# Patient Record
Sex: Male | Born: 1980 | Race: White | Hispanic: No | Marital: Married | Smoking: Never smoker
Health system: Southern US, Community
[De-identification: ages and names within clinical notes are randomized; demographics above are authoritative.]

## PROBLEM LIST (undated history)

## (undated) HISTORY — PX: FRACTURE SURGERY: SHX138

---

## 2004-01-10 ENCOUNTER — Observation Stay: Payer: Self-pay | Admitting: Orthopaedic Surgery

## 2004-01-27 ENCOUNTER — Ambulatory Visit: Payer: Self-pay | Admitting: Orthopaedic Surgery

## 2004-04-21 ENCOUNTER — Ambulatory Visit: Payer: Self-pay | Admitting: Orthopaedic Surgery

## 2013-02-15 ENCOUNTER — Ambulatory Visit (INDEPENDENT_AMBULATORY_CARE_PROVIDER_SITE_OTHER): Payer: BC Managed Care – PPO | Admitting: Physician Assistant

## 2013-02-15 ENCOUNTER — Telehealth: Payer: Self-pay

## 2013-02-15 VITALS — BP 116/76 | HR 64 | Temp 98.4°F | Resp 16 | Ht 73.5 in | Wt 241.6 lb

## 2013-02-15 DIAGNOSIS — Z23 Encounter for immunization: Secondary | ICD-10-CM

## 2013-02-15 DIAGNOSIS — S91309A Unspecified open wound, unspecified foot, initial encounter: Secondary | ICD-10-CM

## 2013-02-15 DIAGNOSIS — S91301A Unspecified open wound, right foot, initial encounter: Secondary | ICD-10-CM

## 2013-02-15 NOTE — Progress Notes (Signed)
  Subjective:    Patient ID: Ryan Montes, male    DOB: 23-May-1980, 32 y.o.   MRN: [id number]  HPI 32 year old male presents for Tdap vaccination.  Yesterday was walking outside in his socks and stepped on a nail. States the nail was on its side so just the end of went into his foot.  The wound did bleed a bit and is now slightly bruised. No erythema, warmth, drainage, or induration. Last tetanus about 7 years ago.   Patient is otherwise doing well with no other concerns today.     Review of Systems  Constitutional: Negative for fever and chills.  Respiratory: Negative for cough.   Neurological: Negative for headaches.       Objective:   Physical Exam  Constitutional: He is oriented to person, place, and time. He appears well-developed and well-nourished.  HENT:  Head: Normocephalic and atraumatic.  Right Ear: External ear normal.  Left Ear: External ear normal.  Eyes: Conjunctivae are normal.  Neck: Normal range of motion.  Cardiovascular: Normal rate.   Pulmonary/Chest: Effort normal.  Neurological: He is alert and oriented to person, place, and time.  Skin:  Plantar surface of right heel has small superficial puncture wound with slight bruising. No erythema, warmth, or drainage noted  Psychiatric: He has a normal mood and affect. His behavior is normal. Judgment and thought content normal.          Assessment & Plan:  Wound, open, foot, right, initial encounter  Need for Tdap vaccination - Plan: Tdap vaccine greater than or equal to 7yo IM  Tdap updated Wound care provided. Return if area becomes erythematous, warm, or more painful

## 2013-02-15 NOTE — Patient Instructions (Signed)

## 2015-04-02 ENCOUNTER — Ambulatory Visit (INDEPENDENT_AMBULATORY_CARE_PROVIDER_SITE_OTHER): Payer: Managed Care, Other (non HMO) | Admitting: Emergency Medicine

## 2015-04-02 VITALS — BP 104/64 | HR 73 | Temp 98.6°F | Resp 16 | Ht 75.0 in | Wt 239.0 lb

## 2015-04-02 DIAGNOSIS — J0140 Acute pansinusitis, unspecified: Secondary | ICD-10-CM | POA: Diagnosis not present

## 2015-04-02 DIAGNOSIS — Z23 Encounter for immunization: Secondary | ICD-10-CM

## 2015-04-02 DIAGNOSIS — R509 Fever, unspecified: Secondary | ICD-10-CM

## 2015-04-02 MED ORDER — DOXYCYCLINE HYCLATE 100 MG PO CAPS: 100.0000 mg | ORAL_CAPSULE | Freq: Two times a day (BID) | ORAL | Status: DC

## 2015-04-02 MED ORDER — PSEUDOEPHEDRINE-GUAIFENESIN ER 60-600 MG PO TB12: 1.0000 | ORAL_TABLET | Freq: Two times a day (BID) | ORAL | Status: AC

## 2015-04-02 NOTE — Patient Instructions (Signed)

## 2015-04-02 NOTE — Progress Notes (Signed)
Subjective:  Patient ID: Ryan Montes, male    DOB: 09/28/1980  Age: 35 y.o. MRN: [id number]  CC: Generalized Body Aches; Chills; Fever; Sore Throat; and Flu Vaccine   HPI Ryan Montes presents  patient comes with fever and nasal congestion drainage. He has postnasal drainage. It's purulent character. He has a fever of 102. He has no cough wheezing or shortness of breath. Sore throat. Is no wheezing or shortness of breath. He has no nausea vomiting or diarrhea. No stool change or rash.  History Ryan Montes has no past medical history on file.   He has past surgical history that includes Fracture surgery.   His  family history includes Cancer in his mother; Diabetes in his maternal grandfather.  He   reports that he has never smoked. He does not have any smokeless tobacco history on file. He reports that he drinks about 5.0 oz of alcohol per week. His drug history is not on file.  No outpatient prescriptions prior to visit.   No facility-administered medications prior to visit.    Social History   Social History  . Marital Status: Married    Spouse Name: N/A  . Number of Children: N/A  . Years of Education: N/A   Social History Main Topics  . Smoking status: Never Smoker   . Smokeless tobacco: None  . Alcohol Use: 5.0 oz/week    10 drink(s) per week  . Drug Use: None  . Sexual Activity: Not Asked   Other Topics Concern  . None   Social History Narrative     Review of Systems  Constitutional: Positive for fever, chills and fatigue. Negative for appetite change.  HENT: Positive for postnasal drip and sore throat. Negative for congestion, ear pain and sinus pressure.   Eyes: Negative for pain and redness.  Respiratory: Negative for cough, shortness of breath and wheezing.   Cardiovascular: Negative for leg swelling.  Gastrointestinal: Negative for nausea, vomiting, abdominal pain, diarrhea, constipation and blood in stool.  Endocrine: Negative for polyuria.    Genitourinary: Negative for dysuria, urgency, frequency and flank pain.  Musculoskeletal: Negative for gait problem.  Skin: Negative for rash.  Neurological: Negative for weakness and headaches.  Psychiatric/Behavioral: Negative for confusion and decreased concentration. The patient is not nervous/anxious.     Objective:  BP 104/64 mmHg  Pulse 73  Temp(Src) 98.6 F (37 C)  Resp 16  Ht 6\' 3"  (1.905 m)  Wt 239 lb (108.41 kg)  BMI 29.87 kg/m2  SpO2 98%  Physical Exam    Assessment & Plan:   Ryan Montes was seen today for generalized body aches, chills, fever, sore throat and flu vaccine.  Diagnoses and all orders for this visit:  Acute pansinusitis, recurrence not specified  Fever, unspecified fever cause  Needs flu shot -     Flu Vaccine QUAD 36+ mos IM  Other orders -     pseudoephedrine-guaifenesin (MUCINEX D) 60-600 MG 12 hr tablet; Take 1 tablet by mouth every 12 (twelve) hours. -     doxycycline (VIBRAMYCIN) 100 MG capsule; Take 1 capsule (100 mg total) by mouth 2 (two) times daily.   I am having Ryan Montes start on pseudoephedrine-guaifenesin and doxycycline.  Meds ordered this encounter  Medications  . pseudoephedrine-guaifenesin (MUCINEX D) 60-600 MG 12 hr tablet    Sig: Take 1 tablet by mouth every 12 (twelve) hours.    Dispense:  18 tablet    Refill:  0  . doxycycline (VIBRAMYCIN) 100 MG capsule  Sig: Take 1 capsule (100 mg total) by mouth 2 (two) times daily.    Dispense:  20 capsule    Refill:  0    Appropriate red flag conditions were discussed with the patient as well as actions that should be taken.  Patient expressed his understanding.  Follow-up: Return if symptoms worsen or fail to improve.  Carmelina Dane, MD

## 2015-04-03 ENCOUNTER — Ambulatory Visit (INDEPENDENT_AMBULATORY_CARE_PROVIDER_SITE_OTHER): Payer: Managed Care, Other (non HMO) | Admitting: Internal Medicine

## 2015-04-03 ENCOUNTER — Telehealth: Payer: Self-pay

## 2015-04-03 VITALS — BP 118/70 | HR 74 | Temp 98.1°F | Resp 18 | Ht 75.0 in | Wt 235.6 lb

## 2015-04-03 DIAGNOSIS — N509 Disorder of male genital organs, unspecified: Secondary | ICD-10-CM

## 2015-04-03 DIAGNOSIS — R102 Pelvic and perineal pain: Secondary | ICD-10-CM

## 2015-04-03 LAB — POCT URINALYSIS DIP (MANUAL ENTRY)
BILIRUBIN UA: NEGATIVE
Glucose, UA: NEGATIVE
Leukocytes, UA: NEGATIVE
Nitrite, UA: NEGATIVE
Protein Ur, POC: NEGATIVE
RBC UA: NEGATIVE
SPEC GRAV UA: 1.01
Urobilinogen, UA: 0.2
pH, UA: 6

## 2015-04-03 LAB — POC MICROSCOPIC URINALYSIS (UMFC)

## 2015-04-03 MED ORDER — MELOXICAM 15 MG PO TABS
15.0000 mg | ORAL_TABLET | Freq: Every day | ORAL | Status: DC
Start: 2015-04-03 — End: 2018-04-20

## 2015-04-03 NOTE — Telephone Encounter (Signed)
Pt called. Was seen yesterday for sinusitis and was given Doxy. He is now having testicle pain. Told them that it was highly unlikely that this was related and he should RTC to get that checked out. He agreed and will come in today

## 2015-04-03 NOTE — Progress Notes (Signed)
Subjective:  By signing my name below, I, Rawaa Al Rifaie, attest that this documentation has been prepared under the direction and in the presence of Ellamae Sia, MD.  Watt Climes Rifaie, Medical Scribe. 04/03/2015.  10:42 AM.   Patient ID: Ryan Montes, male    DOB: [birth year], 35 y.o.   MRN: [id number]  Chief Complaint  Patient presents with  . Generalized Body Aches    x2 days  . Testicular Pain    Started after taking antibiotics prescribed yesterday.     HPI HPI Comments: Ryan Montes is a 35 y.o. male who presents to [facility] complaining of groiin pain, gradual onset yesterday.  Pt was seen here at the office yesterday by Dr. Dareen Piano for acute pansinusitis. He was prescribed mucinex and doxycycline for the symptoms.  Pt reports that the symptoms were initially present prior to taking the antibiotics, however the area was only tender to the touch. He indicates that the symptoms then developed to being constant with the presence of sharp pain. He reports that pain to be present in his testicular area bilaterally, his upper groin area, and the area behind the penis. Pt also states that yesterday he was experiencing a cramping sensation to the arms and legs, but with no actual cramping, that prevented him from being able to sleep. He denies swelling or redness of the area, increased urination, dysuria, diarrhea, constipation, or difficulty to start the stream with urination.      There are no active problems to display for this patient.  History reviewed. No pertinent past medical history. Past Surgical History  Procedure Laterality Date  . Fracture surgery     Allergies  Allergen Reactions  . Augmentin [Amoxicillin-Pot Clavulanate] Hives  . Suprax [Cefixime] Hives   Prior to Admission medications   Medication Sig Start Date End Date Taking? Authorizing Provider  doxycycline (VIBRAMYCIN) 100 MG capsule Take 1 capsule (100 mg total) by mouth 2 (two)  times daily. 04/02/15  Yes Carmelina Dane, MD  pseudoephedrine-guaifenesin [facility] D) 60-600 MG 12 hr tablet Take 1 tablet by mouth every 12 (twelve) hours. 04/02/15 04/01/16 Yes Carmelina Dane, MD   Social History   Social History  . Marital Status: Married    Spouse Name: N/A  . Number of Children: N/A  . Years of Education: N/A   Occupational History  . Not on file.   Social History Main Topics  . Smoking status: Never Smoker   . Smokeless tobacco: Not on file  . Alcohol Use: 5.0 oz/week    10 drink(s) per week  . Drug Use: Not on file  . Sexual Activity: Not on file   Other Topics Concern  . Not on file   Social History Narrative     Review of Systems  Gastrointestinal: Negative for diarrhea and constipation.  Genitourinary: Positive for testicular pain. Negative for dysuria, frequency, penile swelling and difficulty urinating.  Musculoskeletal: Positive for myalgias.      Objective:   Physical Exam  Constitutional: He is oriented to person, place, and time. He appears well-developed and well-nourished. No distress.  HENT:  Head: Normocephalic and atraumatic.  Eyes: EOM are normal. Pupils are equal, round, and reactive to light.  Neck: Neck supple.  Cardiovascular: Normal rate.   Pulmonary/Chest: Effort normal.  Genitourinary:  No skin lesions, no obvious swelling. Tender in both inguinal canals without hernia.  Testicles and epididymes are non tender, but he is tender along the perineum without  swelling.   Neurological: He is alert and oriented to person, place, and time. No cranial nerve deficit.  Skin: Skin is warm and dry.  Psychiatric: He has a normal mood and affect. His behavior is normal.  Nursing note and vitals reviewed.   BP 118/70 mmHg  Pulse 74  Temp(Src) 98.1 F (36.7 C) (Oral)  Resp 18  Ht 6\' 3"  (1.905 m)  Wt 235 lb 9.6 oz (106.867 kg)  BMI 29.45 kg/m2  SpO2 97%     Assessment & Plan:  Pain in male perineum - Plan: POCT  Microscopic Urinalysis ([facility]), POCT urinalysis dipstick unclear etio--may be sports associated//follow Meds ordered this encounter  Medications  . meloxicam (MOBIC) 15 MG tablet    Sig: Take 1 tablet (15 mg total) by mouth daily.    Dispense:  10 tablet    Refill:  0      I have completed the patient encounter in its entirety as documented by the scribe, with editing by me where necessary. Cord Wilczynski P. Merla Richesoolittle, M.D.

## 2018-04-19 ENCOUNTER — Other Ambulatory Visit: Payer: Self-pay

## 2018-04-19 ENCOUNTER — Emergency Department (HOSPITAL_COMMUNITY): Payer: Managed Care, Other (non HMO) | Admitting: Certified Registered"

## 2018-04-19 ENCOUNTER — Encounter (HOSPITAL_COMMUNITY)
Admission: EM | Disposition: A | Discharge status: Home / Self Care | Payer: Self-pay | Source: Home / Self Care | Attending: Emergency Medicine

## 2018-04-19 ENCOUNTER — Encounter (HOSPITAL_COMMUNITY): Payer: Self-pay

## 2018-04-19 ENCOUNTER — Emergency Department (HOSPITAL_COMMUNITY): Payer: Managed Care, Other (non HMO)

## 2018-04-19 ENCOUNTER — Observation Stay (HOSPITAL_COMMUNITY)
Admission: EM | Admit: 2018-04-19 | Discharge: 2018-04-20 | Disposition: A | Discharge status: Home / Self Care | Payer: Managed Care, Other (non HMO) | Attending: Surgery | Admitting: Surgery

## 2018-04-19 DIAGNOSIS — K3580 Unspecified acute appendicitis: Principal | ICD-10-CM | POA: Diagnosis present

## 2018-04-19 DIAGNOSIS — Z791 Long term (current) use of non-steroidal anti-inflammatories (NSAID): Secondary | ICD-10-CM | POA: Insufficient documentation

## 2018-04-19 DIAGNOSIS — R109 Unspecified abdominal pain: Secondary | ICD-10-CM | POA: Diagnosis present

## 2018-04-19 HISTORY — PX: LAPAROSCOPIC APPENDECTOMY: SHX408

## 2018-04-19 LAB — COMPREHENSIVE METABOLIC PANEL
ALBUMIN: 4.5 g/dL (ref 3.5–5.0)
ALT: 33 U/L (ref 0–44)
AST: 29 U/L (ref 15–41)
Alkaline Phosphatase: 68 U/L (ref 38–126)
Anion gap: 10 (ref 5–15)
BUN: 13 mg/dL (ref 6–20)
CO2: 23 mmol/L (ref 22–32)
Calcium: 9.3 mg/dL (ref 8.9–10.3)
Chloride: 105 mmol/L (ref 98–111)
Creatinine, Ser: 0.86 mg/dL (ref 0.61–1.24)
GFR calc Af Amer: >60 mL/min (ref >60)
GFR calc non Af Amer: >60 mL/min (ref >60)
GLUCOSE: 98 mg/dL (ref 70–99)
Potassium: 3.7 mmol/L (ref 3.5–5.1)
SODIUM: 138 mmol/L (ref 135–145)
Total Bilirubin: 0.8 mg/dL (ref 0.3–1.2)
Total Protein: 8.1 g/dL (ref 6.5–8.1)

## 2018-04-19 LAB — LIPASE, BLOOD: Lipase: 32 U/L (ref 11–51)

## 2018-04-19 LAB — CBC WITH DIFFERENTIAL/PLATELET
Abs Immature Granulocytes: 0.02 10*3/uL (ref 0.00–0.07)
BASOS ABS: 0.1 10*3/uL (ref 0.0–0.1)
Basophils Relative: 1 %
Eosinophils Absolute: 0.1 10*3/uL (ref 0.0–0.5)
Eosinophils Relative: 1 %
HCT: 43.2 % (ref 39.0–52.0)
Hemoglobin: 13.8 g/dL (ref 13.0–17.0)
Immature Granulocytes: 0 %
Lymphocytes Relative: 22 %
Lymphs Abs: 2.5 10*3/uL (ref 0.7–4.0)
MCH: 29.6 pg (ref 26.0–34.0)
MCHC: 31.9 g/dL (ref 30.0–36.0)
MCV: 92.7 fL (ref 80.0–100.0)
Monocytes Absolute: 1.2 10*3/uL — ABNORMAL HIGH (ref 0.1–1.0)
Monocytes Relative: 11 %
NRBC: 0 % (ref 0.0–0.2)
Neutro Abs: 7.3 10*3/uL (ref 1.7–7.7)
Neutrophils Relative %: 65 %
Platelets: 219 10*3/uL (ref 150–400)
RBC: 4.66 MIL/uL (ref 4.22–5.81)
RDW: 13.4 % (ref 11.5–15.5)
WBC: 11.1 10*3/uL — ABNORMAL HIGH (ref 4.0–10.5)

## 2018-04-19 SURGERY — APPENDECTOMY, LAPAROSCOPIC
Anesthesia: General | Site: Abdomen

## 2018-04-19 MED ORDER — CIPROFLOXACIN IN D5W 400 MG/200ML IV SOLN
400.0000 mg | INTRAVENOUS | Status: AC
  Administered 2018-04-19: 400 mg via INTRAVENOUS

## 2018-04-19 MED ORDER — MIDAZOLAM HCL 2 MG/2ML IJ SOLN
INTRAMUSCULAR | Status: DC | PRN
  Administered 2018-04-19: 2 mg via INTRAVENOUS

## 2018-04-19 MED ORDER — SUGAMMADEX SODIUM 200 MG/2ML IV SOLN
INTRAVENOUS | Status: DC | PRN
  Administered 2018-04-19: 230 mg via INTRAVENOUS

## 2018-04-19 MED ORDER — DEXAMETHASONE SODIUM PHOSPHATE 10 MG/ML IJ SOLN
INTRAMUSCULAR | Status: AC
  Filled 2018-04-19: qty 1

## 2018-04-19 MED ORDER — HYDROMORPHONE HCL 1 MG/ML IJ SOLN
INTRAMUSCULAR | Status: AC
  Filled 2018-04-19: qty 1

## 2018-04-19 MED ORDER — KCL IN DEXTROSE-NACL 20-5-0.45 MEQ/L-%-% IV SOLN
INTRAVENOUS | Status: DC
  Administered 2018-04-20: 01:00:00 via INTRAVENOUS
  Filled 2018-04-19 (×2): qty 1000

## 2018-04-19 MED ORDER — LACTATED RINGERS IR SOLN
Status: DC | PRN
  Administered 2018-04-19: 1000 mL

## 2018-04-19 MED ORDER — LIDOCAINE 2% (20 MG/ML) 5 ML SYRINGE
INTRAMUSCULAR | Status: AC
  Filled 2018-04-19: qty 5

## 2018-04-19 MED ORDER — TRAMADOL HCL 50 MG PO TABS: 50.0000 mg | ORAL_TABLET | Freq: Four times a day (QID) | ORAL | Status: DC | PRN

## 2018-04-19 MED ORDER — CIPROFLOXACIN IN D5W 400 MG/200ML IV SOLN
INTRAVENOUS | Status: AC
  Filled 2018-04-19: qty 200

## 2018-04-19 MED ORDER — PROMETHAZINE HCL 25 MG/ML IJ SOLN: 6.2500 mg | INTRAMUSCULAR | Status: DC | PRN

## 2018-04-19 MED ORDER — MEPERIDINE HCL 50 MG/ML IJ SOLN: 6.2500 mg | INTRAMUSCULAR | Status: DC | PRN

## 2018-04-19 MED ORDER — METRONIDAZOLE IN NACL 5-0.79 MG/ML-% IV SOLN
INTRAVENOUS | Status: AC
  Filled 2018-04-19: qty 100

## 2018-04-19 MED ORDER — ACETAMINOPHEN 325 MG PO TABS: 650.0000 mg | ORAL_TABLET | Freq: Four times a day (QID) | ORAL | Status: DC | PRN

## 2018-04-19 MED ORDER — DEXMEDETOMIDINE HCL IN NACL 200 MCG/50ML IV SOLN
INTRAVENOUS | Status: AC
  Filled 2018-04-19: qty 50

## 2018-04-19 MED ORDER — SUGAMMADEX SODIUM 200 MG/2ML IV SOLN
INTRAVENOUS | Status: AC
  Filled 2018-04-19: qty 2

## 2018-04-19 MED ORDER — OXYCODONE HCL 5 MG PO TABS
5.0000 mg | ORAL_TABLET | ORAL | Status: DC | PRN
  Administered 2018-04-20 (×2): 5 mg via ORAL
  Filled 2018-04-19 (×2): qty 1

## 2018-04-19 MED ORDER — FENTANYL CITRATE (PF) 100 MCG/2ML IJ SOLN
INTRAMUSCULAR | Status: AC
  Filled 2018-04-19: qty 2

## 2018-04-19 MED ORDER — PROPOFOL 10 MG/ML IV BOLUS
INTRAVENOUS | Status: DC | PRN
  Administered 2018-04-19: 200 mg via INTRAVENOUS

## 2018-04-19 MED ORDER — LACTATED RINGERS IV SOLN
INTRAVENOUS | Status: DC | PRN
  Administered 2018-04-19: 21:00:00 via INTRAVENOUS

## 2018-04-19 MED ORDER — METRONIDAZOLE IN NACL 5-0.79 MG/ML-% IV SOLN
500.0000 mg | INTRAVENOUS | Status: AC
  Administered 2018-04-19: 500 mg via INTRAVENOUS

## 2018-04-19 MED ORDER — METRONIDAZOLE IN NACL 5-0.79 MG/ML-% IV SOLN
500.0000 mg | Freq: Three times a day (TID) | INTRAVENOUS | Status: DC
  Administered 2018-04-20: 500 mg via INTRAVENOUS
  Filled 2018-04-19: qty 100

## 2018-04-19 MED ORDER — ONDANSETRON HCL 4 MG/2ML IJ SOLN
INTRAMUSCULAR | Status: DC | PRN
  Administered 2018-04-19: 4 mg via INTRAVENOUS

## 2018-04-19 MED ORDER — HYDROMORPHONE HCL 1 MG/ML IJ SOLN
2.0000 mg | INTRAMUSCULAR | Status: DC | PRN
  Administered 2018-04-20: 2 mg via INTRAVENOUS
  Filled 2018-04-19: qty 2

## 2018-04-19 MED ORDER — ONDANSETRON HCL 4 MG/2ML IJ SOLN
INTRAMUSCULAR | Status: AC
  Filled 2018-04-19: qty 2

## 2018-04-19 MED ORDER — ONDANSETRON HCL 4 MG/2ML IJ SOLN: 4.0000 mg | Freq: Four times a day (QID) | INTRAMUSCULAR | Status: DC | PRN

## 2018-04-19 MED ORDER — ROCURONIUM BROMIDE 10 MG/ML (PF) SYRINGE
PREFILLED_SYRINGE | INTRAVENOUS | Status: DC | PRN
  Administered 2018-04-19: 40 mg via INTRAVENOUS
  Administered 2018-04-19: 20 mg via INTRAVENOUS

## 2018-04-19 MED ORDER — SODIUM CHLORIDE (PF) 0.9 % IJ SOLN
INTRAMUSCULAR | Status: AC
  Filled 2018-04-19: qty 50

## 2018-04-19 MED ORDER — BUPIVACAINE-EPINEPHRINE 0.25% -1:200000 IJ SOLN
INTRAMUSCULAR | Status: DC | PRN
  Administered 2018-04-19: 20 mL

## 2018-04-19 MED ORDER — SUGAMMADEX SODIUM 500 MG/5ML IV SOLN
INTRAVENOUS | Status: AC
  Filled 2018-04-19: qty 5

## 2018-04-19 MED ORDER — DEXAMETHASONE SODIUM PHOSPHATE 10 MG/ML IJ SOLN
INTRAMUSCULAR | Status: DC | PRN
  Administered 2018-04-19: 8 mg via INTRAVENOUS

## 2018-04-19 MED ORDER — LIDOCAINE 2% (20 MG/ML) 5 ML SYRINGE
INTRAMUSCULAR | Status: DC | PRN
  Administered 2018-04-19: 100 mg via INTRAVENOUS

## 2018-04-19 MED ORDER — CHLORHEXIDINE GLUCONATE CLOTH 2 % EX PADS: 6.0000 | MEDICATED_PAD | Freq: Once | CUTANEOUS | Status: DC

## 2018-04-19 MED ORDER — SUCCINYLCHOLINE CHLORIDE 200 MG/10ML IV SOSY
PREFILLED_SYRINGE | INTRAVENOUS | Status: AC
  Filled 2018-04-19: qty 10

## 2018-04-19 MED ORDER — IOPAMIDOL (ISOVUE-300) INJECTION 61%
INTRAVENOUS | Status: AC
  Filled 2018-04-19: qty 100

## 2018-04-19 MED ORDER — FENTANYL CITRATE (PF) 250 MCG/5ML IJ SOLN
INTRAMUSCULAR | Status: DC | PRN
  Administered 2018-04-19: 250 ug via INTRAVENOUS
  Administered 2018-04-19: 50 ug via INTRAVENOUS

## 2018-04-19 MED ORDER — SODIUM CHLORIDE 0.9 % IR SOLN
Status: DC | PRN
  Administered 2018-04-19: 1000 mL

## 2018-04-19 MED ORDER — HYDROMORPHONE HCL 1 MG/ML IJ SOLN
0.2500 mg | INTRAMUSCULAR | Status: DC | PRN
  Administered 2018-04-19 (×2): 0.25 mg via INTRAVENOUS

## 2018-04-19 MED ORDER — ONDANSETRON 4 MG PO TBDP: 4.0000 mg | ORAL_TABLET | Freq: Four times a day (QID) | ORAL | Status: DC | PRN

## 2018-04-19 MED ORDER — SUCCINYLCHOLINE CHLORIDE 200 MG/10ML IV SOSY
PREFILLED_SYRINGE | INTRAVENOUS | Status: DC | PRN
  Administered 2018-04-19: 120 mg via INTRAVENOUS

## 2018-04-19 MED ORDER — HYDROCODONE-ACETAMINOPHEN 7.5-325 MG PO TABS: 1.0000 | ORAL_TABLET | Freq: Once | ORAL | Status: DC | PRN

## 2018-04-19 MED ORDER — ROCURONIUM BROMIDE 100 MG/10ML IV SOLN
INTRAVENOUS | Status: AC
  Filled 2018-04-19: qty 1

## 2018-04-19 MED ORDER — ACETAMINOPHEN 10 MG/ML IV SOLN
INTRAVENOUS | Status: AC
  Filled 2018-04-19: qty 100

## 2018-04-19 MED ORDER — ACETAMINOPHEN 650 MG RE SUPP: 650.0000 mg | Freq: Four times a day (QID) | RECTAL | Status: DC | PRN

## 2018-04-19 MED ORDER — CIPROFLOXACIN IN D5W 400 MG/200ML IV SOLN
400.0000 mg | Freq: Two times a day (BID) | INTRAVENOUS | Status: DC
  Administered 2018-04-20: 400 mg via INTRAVENOUS
  Filled 2018-04-19: qty 200

## 2018-04-19 MED ORDER — FENTANYL CITRATE (PF) 250 MCG/5ML IJ SOLN
INTRAMUSCULAR | Status: AC
  Filled 2018-04-19: qty 5

## 2018-04-19 MED ORDER — DEXMEDETOMIDINE HCL IN NACL 200 MCG/50ML IV SOLN
INTRAVENOUS | Status: DC | PRN
  Administered 2018-04-19: 12 ug via INTRAVENOUS

## 2018-04-19 MED ORDER — ACETAMINOPHEN 10 MG/ML IV SOLN
1000.0000 mg | Freq: Once | INTRAVENOUS | Status: DC | PRN
  Administered 2018-04-19: 1000 mg via INTRAVENOUS

## 2018-04-19 MED ORDER — IOPAMIDOL (ISOVUE-300) INJECTION 61%: 100.0000 mL | Freq: Once | INTRAVENOUS | Status: DC | PRN

## 2018-04-19 MED ORDER — MIDAZOLAM HCL 2 MG/2ML IJ SOLN
INTRAMUSCULAR | Status: AC
  Filled 2018-04-19: qty 2

## 2018-04-19 MED ORDER — PROPOFOL 10 MG/ML IV BOLUS
INTRAVENOUS | Status: AC
  Filled 2018-04-19: qty 20

## 2018-04-19 MED ORDER — CHLORHEXIDINE GLUCONATE CLOTH 2 % EX PADS
6.0000 | MEDICATED_PAD | Freq: Once | CUTANEOUS | Status: AC
  Administered 2018-04-19: 6 via TOPICAL

## 2018-04-19 MED ORDER — BUPIVACAINE-EPINEPHRINE (PF) 0.25% -1:200000 IJ SOLN
INTRAMUSCULAR | Status: AC
  Filled 2018-04-19: qty 30

## 2018-04-19 SURGICAL SUPPLY — 31 items
APPLIER CLIP ROT 10 11.4 M/L (STAPLE)
CHLORAPREP W/TINT 26ML (MISCELLANEOUS) ×3 IMPLANT
CLIP APPLIE ROT 10 11.4 M/L (STAPLE) IMPLANT
CLOSURE WOUND 1/2 X4 (GAUZE/BANDAGES/DRESSINGS)
COVER SURGICAL LIGHT HANDLE (MISCELLANEOUS) ×3 IMPLANT
COVER WAND RF STERILE (DRAPES) IMPLANT
CUTTER FLEX LINEAR 45M (STAPLE) ×3 IMPLANT
DECANTER SPIKE VIAL GLASS SM (MISCELLANEOUS) ×3 IMPLANT
DERMABOND ADVANCED (GAUZE/BANDAGES/DRESSINGS) ×2
DERMABOND ADVANCED .7 DNX12 (GAUZE/BANDAGES/DRESSINGS) ×1 IMPLANT
DRAPE LAPAROSCOPIC ABDOMINAL (DRAPES) ×3 IMPLANT
ELECT REM PT RETURN 15FT ADLT (MISCELLANEOUS) ×3 IMPLANT
ENDOLOOP SUT PDS II  0 18 (SUTURE)
ENDOLOOP SUT PDS II 0 18 (SUTURE) IMPLANT
GLOVE SURG ORTHO 8.0 STRL STRW (GLOVE) ×3 IMPLANT
GOWN STRL REUS W/TWL XL LVL3 (GOWN DISPOSABLE) ×6 IMPLANT
KIT BASIN OR (CUSTOM PROCEDURE TRAY) ×3 IMPLANT
POUCH SPECIMEN RETRIEVAL 10MM (ENDOMECHANICALS) ×3 IMPLANT
RELOAD 45 VASCULAR/THIN (ENDOMECHANICALS) IMPLANT
RELOAD STAPLE TA45 3.5 REG BLU (ENDOMECHANICALS) ×6 IMPLANT
SET IRRIG TUBING LAPAROSCOPIC (IRRIGATION / IRRIGATOR) ×3 IMPLANT
SHEARS HARMONIC ACE PLUS 36CM (ENDOMECHANICALS) ×3 IMPLANT
STRIP CLOSURE SKIN 1/2X4 (GAUZE/BANDAGES/DRESSINGS) IMPLANT
SUT MNCRL AB 4-0 PS2 18 (SUTURE) ×3 IMPLANT
TOWEL OR 17X26 10 PK STRL BLUE (TOWEL DISPOSABLE) ×3 IMPLANT
TOWEL OR NON WOVEN STRL DISP B (DISPOSABLE) ×3 IMPLANT
TRAY FOLEY MTR SLVR 14FR STAT (SET/KITS/TRAYS/PACK) IMPLANT
TRAY FOLEY MTR SLVR 16FR STAT (SET/KITS/TRAYS/PACK) ×3 IMPLANT
TRAY LAPAROSCOPIC (CUSTOM PROCEDURE TRAY) ×3 IMPLANT
TROCAR XCEL BLUNT TIP 100MML (ENDOMECHANICALS) ×3 IMPLANT
TROCAR XCEL NON-BLD 11X100MML (ENDOMECHANICALS) ×3 IMPLANT

## 2018-04-19 NOTE — ED Triage Notes (Signed)
Pt presents after having a confirmed diagnosis today of appendicitis. Pt has had a CT confirming this. Pt reports he has had some abdominal pain since Tuesday of this week.

## 2018-04-19 NOTE — H&P (Signed)
Ryan Montes is an 38 y.o. male.    General Surgery [facility]  Chief Complaint: abdominal pain, acute appendicitis  HPI: Patient is a [age] year old male who developed generalized central abdominal pain.  This was associated with onset of anorexia.  He denies fevers or chills.  He denies nausea or vomiting.  Pain became more severe and localized to the right lower quadrant.  He was seen at an urgent care and referred to [facility] scan.  By report, the patient had acute appendicitis and was sent to the emergency department at [facility].  Unfortunately, records from the urgent care and from the radiologic facility are unavailable for review.  Repeat laboratory studies did show a mildly elevated white blood cell count.  Patient gives a good clinical history for acute appendicitis.  Pain is exacerbated with physical activity and walking.  Pain is increased by going down the steps.  Patient has had no prior abdominal surgery.  History reviewed. No pertinent past medical history.  Past Surgical History:  Procedure Laterality Date  . FRACTURE SURGERY      Family History  Problem Relation Age of Onset  . Cancer Mother        rectal  . Diabetes Maternal Grandfather    Social History:  reports that he has never smoked. He does not have any smokeless tobacco history on file. He reports current alcohol use of about 10.0 standard drinks of alcohol per week. No history on file for drug.  Allergies:  Allergies  Allergen Reactions  . Augmentin [Amoxicillin-Pot Clavulanate] Hives  . Suprax [Cefixime] Hives    (Not in a hospital admission)   Results for orders placed or performed during the hospital encounter of 04/19/18 (from the past 48 hour(s))  CBC with Differential/Platelet     Status: Abnormal   Collection Time: 04/19/18  5:53 PM  Result Value Ref Range   WBC 11.1 (H) 4.0 - 10.5 K/uL   RBC 4.66 4.22 - 5.81 MIL/uL   Hemoglobin 13.8 13.0 - 17.0 g/dL   HCT 24.443.2 01.039.0 - 27.252.0 %   MCV 92.7 80.0 - 100.0 fL   MCH 29.6 26.0 - 34.0 pg   MCHC 31.9 30.0 - 36.0 g/dL   RDW 53.613.4 64.411.5 - 03.415.5 %   Platelets 219 150 - 400 K/uL   nRBC 0.0 0.0 - 0.2 %   Neutrophils Relative % 65 %   Neutro Abs 7.3 1.7 - 7.7 K/uL   Lymphocytes Relative 22 %   Lymphs Abs 2.5 0.7 - 4.0 K/uL   Monocytes Relative 11 %   Monocytes Absolute 1.2 (H) 0.1 - 1.0 K/uL   Eosinophils Relative 1 %   Eosinophils Absolute 0.1 0.0 - 0.5 K/uL   Basophils Relative 1 %   Basophils Absolute 0.1 0.0 - 0.1 K/uL   Immature Granulocytes 0 %   Abs Immature Granulocytes 0.02 0.00 - 0.07 K/uL    Comment: Performed at [facility], [address] [place], [place] [address]  Comprehensive metabolic panel     Status: None   Collection Time: 04/19/18  5:53 PM  Result Value Ref Range   Sodium 138 135 - 145 mmol/L   Potassium 3.7 3.5 - 5.1 mmol/L   Chloride 105 98 - 111 mmol/L   CO2 23 22 - 32 mmol/L   Glucose, Bld 98 70 - 99 mg/dL   BUN 13 6 - 20 mg/dL   Creatinine, Ser 5.630.86 0.61 - 1.24  mg/dL   Calcium 9.3 8.9 - 88.4 mg/dL   Total Protein 8.1 6.5 - 8.1 g/dL   Albumin 4.5 3.5 - 5.0 g/dL   AST 29 15 - 41 U/L   ALT 33 0 - 44 U/L   Alkaline Phosphatase 68 38 - 126 U/L   Total Bilirubin 0.8 0.3 - 1.2 mg/dL   GFR calc non Af Amer >60 >60 mL/min   GFR calc Af Amer >60 >60 mL/min   Anion gap 10 5 - 15    Comment: Performed at [facility], [address] [place], [place] [address]  Lipase, blood     Status: None   Collection Time: 04/19/18  5:53 PM  Result Value Ref Range   Lipase 32 11 - 51 U/L    Comment: Performed at [facility], [address], [place], [place] [address]   No results found.  Review of Systems  Constitutional: Negative for chills, diaphoresis and fever.  HENT: Negative.   Eyes: Negative.   Respiratory: Negative.   Cardiovascular: Negative.   Gastrointestinal: Positive for abdominal pain. Negative for nausea  and vomiting.  Genitourinary: Negative.   Musculoskeletal: Negative.   Skin: Negative.   Neurological: Negative.   Endo/Heme/Allergies: Negative.   Psychiatric/Behavioral: Negative.     Blood pressure 139/85, pulse 84, temperature 97.6 F (36.4 C), temperature source Oral, resp. rate 18, SpO2 100 %. Physical Exam  Constitutional: He is oriented to person, place, and time. He appears well-developed and well-nourished. No distress.  HENT:  Head: Normocephalic and atraumatic.  Right Ear: External ear normal.  Left Ear: External ear normal.  Mouth/Throat: No oropharyngeal exudate.  Eyes: Pupils are equal, round, and reactive to light. Conjunctivae are normal. No scleral icterus.  Neck: Normal range of motion. Neck supple. No tracheal deviation present. No thyromegaly present.  Cardiovascular: Normal rate, regular rhythm and normal heart sounds.  No murmur heard. Respiratory: Effort normal and breath sounds normal. No respiratory distress. He has no wheezes.  GI: Soft. Bowel sounds are normal. He exhibits no distension and no mass. There is abdominal tenderness (RLQ max). There is rebound and guarding.  Musculoskeletal: Normal range of motion.        General: No deformity or edema.  Neurological: He is alert and oriented to person, place, and time.  Skin: Skin is warm and dry. He is not diaphoretic.  Psychiatric: He has a normal mood and affect. His behavior is normal.     Assessment/Plan Acute appendicitis  Admit to general surgery service Begin IV abx Plan lap appendectomy this evening  I discussed the above findings with the patient in detail.  I explained to him that the CT scan images and report were not available to [place] despite efforts by the emergency department to obtain them.  Clinically the patient does have signs and symptoms of acute appendicitis.  Physical examination is consistent with the diagnosis.  I feel comfortable proceeding with laparoscopic appendectomy.  The  risks and benefits of the procedure have been discussed at length with the patient.  The patient understands the proposed procedure, potential alternative treatments, and the course of recovery to be expected.  All of the patient's questions have been answered at this time.  The patient wishes to proceed with surgery.   Darnell Level, MD 04/19/2018, 8:33 PM

## 2018-04-19 NOTE — ED Provider Notes (Signed)
[facility]-EMERGENCY DEPT Provider Note   CSN: [id number] Arrival date & time: 04/19/18  1718     History   Chief Complaint Chief Complaint  Patient presents with  . Confirmed Appendicitis     HPI Ryan Montes is a 38 y.o. male.  The history is provided by the patient. No language interpreter was used.     38 year old male presenting to ED for evaluation of abdominal pain.  Patient mention for the past 3 days he has had progressive worsening pain to his abdomen.  Pain initially started in the mid abdomen radiates towards the right lower quadrant, described as sharp, crampy sensation, waxing waning currently moderate in severity.  He report no appetite, last meal was 2 days ago.  He went to urgent care today for evaluation.  States that he had a CT scan done and 2 hours later he was contacted with recommendation to come to the ER for confirm appendicitis.  Patient denies any prior abdominal surgery.  He also denies any fever or chills, notes nausea without vomiting.  Denies any dysuria, hematuria, penile pain or testicle pain.  Rates abdominal pain is 6 of 10 at this time.  History reviewed. No pertinent past medical history.  There are no active problems to display for this patient.   Past Surgical History:  Procedure Laterality Date  . FRACTURE SURGERY          Home Medications    Prior to Admission medications   Medication Sig Start Date End Date Taking? Authorizing Provider  doxycycline (VIBRAMYCIN) 100 MG capsule Take 1 capsule (100 mg total) by mouth 2 (two) times daily. 04/02/15   Carmelina Dane, MD  meloxicam (MOBIC) 15 MG tablet Take 1 tablet (15 mg total) by mouth daily. 04/03/15   Tonye Pearson, MD    Family History Family History  Problem Relation Age of Onset  . Cancer Mother        rectal  . Diabetes Maternal Grandfather     Social History Social History   Tobacco Use  . Smoking status: Never Smoker  Substance Use  Topics  . Alcohol use: Yes    Alcohol/week: 10.0 standard drinks    Types: 10 drink(s) per week  . Drug use: Not on file     Allergies   Augmentin [amoxicillin-pot clavulanate] and Suprax [cefixime]   Review of Systems Review of Systems  All other systems reviewed and are negative.    Physical Exam Updated Vital Signs BP 139/85 (BP Location: Left Arm)   Pulse 84   Temp 97.6 F (36.4 C) (Oral)   Resp 18   SpO2 100%   Physical Exam Vitals signs and nursing note reviewed. Exam conducted with a chaperone present.  Constitutional:      General: He is not in acute distress.    Appearance: He is well-developed.  HENT:     Head: Atraumatic.  Eyes:     Conjunctiva/sclera: Conjunctivae normal.  Neck:     Musculoskeletal: Neck supple.  Cardiovascular:     Rate and Rhythm: Normal rate and regular rhythm.  Pulmonary:     Effort: Pulmonary effort is normal.     Breath sounds: Normal breath sounds.  Abdominal:     General: Abdomen is flat.     Palpations: Abdomen is soft.     Tenderness: There is abdominal tenderness (Tenderness right lower quadrant without guarding or rebound tenderness).     Comments: Negative Murphy sign.  Negative psoas and  obturator sign.  Genitourinary:    Comments: Normal circumcised penis without lesion or rash, no tenderness to testicle, scrotum is soft and nontender.  No evidence of inguinal hernia or inguinal lymphadenopathy. Skin:    Findings: No rash.  Neurological:     Mental Status: He is alert.      ED Treatments / Results  Labs (all labs ordered are listed, but only abnormal results are displayed) Labs Reviewed  CBC WITH DIFFERENTIAL/PLATELET - Abnormal; Notable for the following components:      Result Value   WBC 11.1 (*)    Monocytes Absolute 1.2 (*)    All other components within normal limits  COMPREHENSIVE METABOLIC PANEL  LIPASE, BLOOD  URINALYSIS, ROUTINE W REFLEX MICROSCOPIC    EKG None  Radiology No results  found.  Procedures Procedures (including critical care time)  Medications Ordered in ED Medications  iopamidol (ISOVUE-300) 61 % injection 100 mL (has no administration in time range)  iopamidol (ISOVUE-300) 61 % injection (has no administration in time range)  sodium chloride (PF) 0.9 % injection (has no administration in time range)     Initial Impression / Assessment and Plan / ED Course  I have reviewed the triage vital signs and the nursing notes.  Pertinent labs & imaging results that were available during my care of the patient were reviewed by me and considered in my medical decision making (see chart for details).     BP 139/85 (BP Location: Left Arm)   Pulse 84   Temp 97.6 F (36.4 C) (Oral)   Resp 18   SpO2 100%    Final Clinical Impressions(s) / ED Diagnoses   Final diagnoses:  Acute appendicitis, unspecified acute appendicitis type    ED Discharge Orders    None     5:51 PM  Patient here with right lower quadrant abdominal pain and reportedly had a CT scan from urgent care confirmed appendicitis.  There are no documentation available within our system.  6:57 PM Vital signs stable, labs remarkable for mildly elevated white count of 11.1 the remainder of his electrolytes are normal.  I have consulted with on-call surgeon, Dr. Gerrit Friends who agrees to see patient in the ED and will determine disposition.  8:41 PM Pt was seen by DR. Gerkin.  He will go to the OR for appendectomy.     Fayrene Helper, PA-C 04/19/18 2041    Lorre Nick, MD 04/19/18 [phone]

## 2018-04-19 NOTE — Op Note (Signed)
OPERATIVE REPORT - LAPAROSCOPIC APPENDECTOMY  Preop diagnosis:  Acute appendicitis  Postop diagnosis:  same  Procedure:  Laparoscopic appendectomy  Surgeon:  Darnell Level, MD  Anesthesia:  general endotracheal  Estimated blood loss:  minimal  Preparation:  Chlora-prep  Complications:  none  Indications:  Patient is a 38 year old male who developed generalized central abdominal pain.  This was associated with onset of anorexia.  He denies fevers or chills.  He denies nausea or vomiting.  Pain became more severe and localized to the right lower quadrant.  He was seen at an urgent care and referred to [facility] for CT scan.  By report, the patient had acute appendicitis and was sent to the emergency department at [facility].  Unfortunately, records from the urgent care and from the radiologic facility are unavailable for review.  Repeat laboratory studies did show a mildly elevated white blood cell count.  Patient gives a good clinical history for acute appendicitis.  Pain is exacerbated with physical activity and walking.  Pain is increased by going down the steps.  Patient has had no prior abdominal surgery.  Procedure:  Patient is brought to the operating room and placed in a supine position on the operating room table. Following administration of general anesthesia, a time out was held and the patient's name and procedure is confirmed. Patient is then prepped and draped in the usual strict aseptic fashion.  After ascertaining that an adequate level of anesthesia has been achieved, a peri-umbilical incision is made with a #15 blade. Dissection is carried down to the fascia. Fascia is incised in the midline and the peritoneal cavity is entered cautiously. A #0-vicryl pursestring suture is placed in the fascia. An Hassan cannula is introduced under direct vision and secured with the pursestring suture. The abdomen is insufflated with carbon dioxide. The laparoscope is introduced and  the abdomen is explored. Operative ports are placed in the right upper quadrant and left lower quadrant. The appendix is identified. The mesoappendix is divided with the harmonic scalpel. Dissection is carried down to the base of the appendix. The base of the appendix is dissected out clearing the junction with the cecal wall. Using an Endo-GIA stapler, the base of the appendix is transected at the junction with the cecal wall. There is good approximation of tissue along the staple line. There is good hemostasis along the staple line. The appendix is placed into an endo-catch bag and withdrawn through the umbilical port. The #0-vicryl pursestring suture is tied securely.  Right lower quadrant is irrigated with warm saline which is evacuated. Good hemostasis is noted. Ports are removed under direct vision. Good hemostasis is noted at the port sites. Pneumoperitoneum is released.  Skin incisions are anesthetized with local anesthetic. Wounds are closed with interrupted 4-0 Monocryl subcuticular sutures. Wounds are washed and dried and Dermabond was applied. The patient is awakened from anesthesia and brought to the recovery room. The patient tolerated the procedure well.  Darnell Level, MD [facility] Office: [phone]

## 2018-04-19 NOTE — Anesthesia Procedure Notes (Signed)
Procedure Name: Intubation Date/Time: 04/19/2018 9:08 PM Performed by: Niel Hummer, CRNA Pre-anesthesia Checklist: Patient being monitored, Suction available, Emergency Drugs available and Patient identified Patient Re-evaluated:Patient Re-evaluated prior to induction Oxygen Delivery Method: Circle system utilized Preoxygenation: Pre-oxygenation with 100% oxygen Induction Type: IV induction, Rapid sequence and Cricoid Pressure applied Laryngoscope Size: Mac and 4 Grade View: Grade I Tube type: Oral Tube size: 7.5 mm Number of attempts: 1 Airway Equipment and Method: Stylet Placement Confirmation: ETT inserted through vocal cords under direct vision,  positive ETCO2 and breath sounds checked- equal and bilateral Secured at: 22 cm Tube secured with: Tape Dental Injury: Teeth and Oropharynx as per pre-operative assessment

## 2018-04-19 NOTE — Anesthesia Postprocedure Evaluation (Signed)
Anesthesia Post Note  Patient: Ryan Montes  Procedure(s) Performed: APPENDECTOMY LAPAROSCOPIC (N/A Abdomen)     Patient location during evaluation: PACU Anesthesia Type: General Level of consciousness: awake and alert Pain management: pain level controlled Vital Signs Assessment: post-procedure vital signs reviewed and stable Respiratory status: spontaneous breathing, nonlabored ventilation, respiratory function stable and patient connected to nasal cannula oxygen Cardiovascular status: blood pressure returned to baseline and stable Postop Assessment: no apparent nausea or vomiting Anesthetic complications: no    Last Vitals:  Vitals:   04/19/18 2245 04/19/18 2300  BP: 140/85 139/85  Pulse: 75 75  Resp: (!) 21 19  Temp:  36.9 C  SpO2: 100% 100%    Last Pain:  Vitals:   04/19/18 2245  TempSrc:   PainSc: 7                  Trevor Iha

## 2018-04-19 NOTE — ED Notes (Signed)
Call fro OR pt 15 min until pick up

## 2018-04-19 NOTE — Transfer of Care (Signed)
Immediate Anesthesia Transfer of Care Note  Patient: Ryan Montes  Procedure(s) Performed: APPENDECTOMY LAPAROSCOPIC (N/A Abdomen)  Patient Location: PACU  Anesthesia Type:General  Level of Consciousness: awake, alert  and oriented  Airway & Oxygen Therapy: Patient Spontanous Breathing and Patient connected to face mask oxygen  Post-op Assessment: Report given to RN and Post -op Vital signs reviewed and stable  Post vital signs: Reviewed and stable  Last Vitals:  Vitals Value Taken Time  BP 158/92 04/19/2018 10:15 PM  Temp 36.9 C 04/19/2018 10:15 PM  Pulse 80 04/19/2018 10:17 PM  Resp 23 04/19/2018 10:17 PM  SpO2 100 % 04/19/2018 10:17 PM  Vitals shown include unvalidated device data.  Last Pain:  Vitals:   04/19/18 1940  TempSrc:   PainSc: 0-No pain         Complications: No apparent anesthesia complications

## 2018-04-19 NOTE — Anesthesia Preprocedure Evaluation (Addendum)
Anesthesia Evaluation  Patient identified by MRN, date of birth, ID band Patient awake    Reviewed: Allergy & Precautions, NPO status , Patient's Chart, lab work & pertinent test results  Airway Mallampati: II  TM Distance: >3 FB Neck ROM: Full   Comment: Beard Dental no notable dental hx. (+) Teeth Intact, Dental Advisory Given   Pulmonary neg pulmonary ROS,    Pulmonary exam normal breath sounds clear to auscultation       Cardiovascular Exercise Tolerance: Good negative cardio ROS Normal cardiovascular exam Rhythm:Regular Rate:Normal     Neuro/Psych negative neurological ROS  negative psych ROS   GI/Hepatic Neg liver ROS,   Endo/Other  negative endocrine ROS  Renal/GU negative Renal ROS     Musculoskeletal negative musculoskeletal ROS (+)   Abdominal   Peds  Hematology   Anesthesia Other Findings   Reproductive/Obstetrics                            Lab Results  Component Value Date   WBC 11.1 (H) 04/19/2018   HGB 13.8 04/19/2018   HCT 43.2 04/19/2018   MCV 92.7 04/19/2018   PLT 219 04/19/2018    Anesthesia Physical Anesthesia Plan  ASA: I and emergent  Anesthesia Plan: General   Post-op Pain Management:    Induction: Intravenous, Cricoid pressure planned and Rapid sequence  PONV Risk Score and Plan: 2 and Treatment may vary due to age or medical condition, Ondansetron and Dexamethasone  Airway Management Planned: Oral ETT  Additional Equipment:   Intra-op Plan:   Post-operative Plan: Extubation in OR  Informed Consent: I have reviewed the patients History and Physical, chart, labs and discussed the procedure including the risks, benefits and alternatives for the proposed anesthesia with the patient or authorized representative who has indicated his/her understanding and acceptance.     Dental advisory given  Plan Discussed with: CRNA  Anesthesia Plan Comments:         Anesthesia Quick Evaluation

## 2018-04-20 ENCOUNTER — Encounter (HOSPITAL_COMMUNITY): Payer: Self-pay | Admitting: *Deleted

## 2018-04-20 ENCOUNTER — Other Ambulatory Visit: Payer: Self-pay

## 2018-04-20 MED ORDER — ONDANSETRON 4 MG PO TBDP: 4.0000 mg | ORAL_TABLET | Freq: Four times a day (QID) | ORAL | 0 refills | Status: DC | PRN

## 2018-04-20 MED ORDER — POLYETHYLENE GLYCOL 3350 17 G PO PACK: 17.0000 g | PACK | Freq: Every day | ORAL | 0 refills | Status: DC | PRN

## 2018-04-20 MED ORDER — IBUPROFEN 200 MG PO TABS: 600.0000 mg | ORAL_TABLET | Freq: Four times a day (QID) | ORAL | Status: DC | PRN

## 2018-04-20 MED ORDER — OXYCODONE HCL 5 MG PO TABS: 5.0000 mg | ORAL_TABLET | Freq: Four times a day (QID) | ORAL | 0 refills | Status: DC | PRN

## 2018-04-20 NOTE — Discharge Instructions (Signed)
[facility]  Please arrive at least 30 min before your appointment to complete your check in paperwork.  If you are unable to arrive 30 min prior to your appointment time we may have to cancel or reschedule you. LAPAROSCOPIC SURGERY: POST OP INSTRUCTIONS Always review your discharge instruction sheet given to you by the facility where your surgery was performed. IF YOU HAVE DISABILITY OR FAMILY LEAVE FORMS, YOU MUST BRING THEM TO THE OFFICE FOR PROCESSING.   DO NOT GIVE THEM TO YOUR DOCTOR.  PAIN CONTROL  1. First take acetaminophen (Tylenol) AND/or ibuprofen (Advil) to control your pain after surgery.  Follow directions on package.  Taking acetaminophen (Tylenol) and/or ibuprofen (Advil) regularly after surgery will help to control your pain and lower the amount of prescription pain medication you may need.  You should not take more than 4,000 mg (4 grams) of acetaminophen (Tylenol) in 24 hours.  You should not take ibuprofen (Advil), aleve, motrin, naprosyn or other NSAIDS if you have a history of stomach ulcers or chronic kidney disease.  2. A prescription for pain medication may be given to you upon discharge.  Take your pain medication as prescribed, if you still have uncontrolled pain after taking acetaminophen (Tylenol) or ibuprofen (Advil). 3. Use ice packs to help control pain. 4. If you need a refill on your pain medication, please contact your pharmacy.  They will contact our office to request authorization. Prescriptions will not be filled after 5pm or on week-ends.  HOME MEDICATIONS 5. Take your usually prescribed medications unless otherwise directed.  DIET 6. You should follow a light diet the first few days after arrival home.  Be sure to include lots of fluids daily. Avoid fatty, fried foods.   CONSTIPATION 7. It is common to experience some constipation after surgery and if you are taking pain medication.  Increasing fluid intake and taking a stool  softener (such as Colace) will usually help or prevent this problem from occurring.  A mild laxative (Milk of Magnesia or Miralax) should be taken according to package instructions if there are no bowel movements after 48 hours.  WOUND/INCISION CARE 8. Most patients will experience some swelling and bruising in the area of the incisions.  Ice packs will help.  Swelling and bruising can take several days to resolve.  9. Unless discharge instructions indicate otherwise, follow guidelines below  a. STERI-STRIPS - you may remove your outer bandages 48 hours after surgery, and you may shower at that time.  You have steri-strips (small skin tapes) in place directly over the incision.  These strips should be left on the skin for 7-10 days.   b. DERMABOND/SKIN GLUE - you may shower in 24 hours.  The glue will flake off over the next 2-3 weeks. 10. Any sutures or staples will be removed at the office during your follow-up visit.  ACTIVITIES 11. You may resume regular (light) daily activities beginning the next day--such as daily self-care, walking, climbing stairs--gradually increasing activities as tolerated.  You may have sexual intercourse when it is comfortable.  Refrain from any heavy lifting or straining until approved by your doctor. a. You may drive when you are no longer taking prescription pain medication, you can comfortably wear a seatbelt, and you can safely maneuver your car and apply brakes.  FOLLOW-UP 12. You should see your doctor in the office for a follow-up appointment approximately 2-3 weeks after your surgery.  You should have been given your post-op/follow-up appointment when   your surgery was scheduled.  If you did not receive a post-op/follow-up appointment, make sure that you call for this appointment within a day or two after you arrive home to insure a convenient appointment time.   WHEN TO CALL YOUR DOCTOR: 1. Fever over 101.0 2. Inability to urinate 3. Continued bleeding from  incision. 4. Increased pain, redness, or drainage from the incision. 5. Increasing abdominal pain  The clinic staff is available to answer your questions during regular business hours.  Please don't hesitate to call and ask to speak to one of the nurses for clinical concerns.  If you have a medical emergency, go to the nearest emergency room or call 911.  A surgeon from [facility] is always on call at the hospital. [address], [address], [place], [place]  [address] ? [address] [address], [place], [place]   [address] [phone] ? [phone] ? FAX [phone]  .........   Managing Your Pain After Surgery Without Opioids    Thank you for participating in our program to help patients manage their pain after surgery without opioids. This is part of our effort to provide you with the best care possible, without exposing you or your family to the risk that opioids pose.  What pain can I expect after surgery? You can expect to have some pain after surgery. This is normal. The pain is typically worse the day after surgery, and quickly begins to get better. Many studies have found that many patients are able to manage their pain after surgery with Over-the-Counter (OTC) medications such as Tylenol and Motrin. If you have a condition that does not allow you to take Tylenol or Motrin, notify your surgical team.  How will I manage my pain? The best strategy for controlling your pain after surgery is around the clock pain control with Tylenol (acetaminophen) and Motrin (ibuprofen or Advil). Alternating these medications with each other allows you to maximize your pain control. In addition to Tylenol and Motrin, you can use heating pads or ice packs on your incisions to help reduce your pain.  How will I alternate your regular strength over-the-counter pain medication? You will take a dose of pain medication every three hours. ; Start by taking 650 mg of Tylenol (2 pills of 325  mg) ; 3 hours later take 600 mg of Motrin (3 pills of 200 mg) ; 3 hours after taking the Motrin take 650 mg of Tylenol ; 3 hours after that take 600 mg of Motrin.   - 1 -  See example - if your first dose of Tylenol is at 12:00 PM   12:00 PM Tylenol 650 mg (2 pills of 325 mg)  3:00 PM Motrin 600 mg (3 pills of 200 mg)  6:00 PM Tylenol 650 mg (2 pills of 325 mg)  9:00 PM Motrin 600 mg (3 pills of 200 mg)  Continue alternating every 3 hours   We recommend that you follow this schedule around-the-clock for at least 3 days after surgery, or until you feel that it is no longer needed. Use the table on the last page of this handout to keep track of the medications you are taking. Important: Do not take more than 3000mg of Tylenol or 3200mg of Motrin in a 24-hour period. Do not take ibuprofen/Motrin if you have a history of bleeding stomach ulcers, severe kidney disease, &/or actively taking a blood thinner  What if I still have pain? If you have pain that is not   controlled with the over-the-counter pain medications (Tylenol and Motrin or Advil) you might have what we call "breakthrough" pain. You will receive a prescription for a small amount of an opioid pain medication such as Oxycodone, Tramadol, or Tylenol with Codeine. Use these opioid pills in the first 24 hours after surgery if you have breakthrough pain. Do not take more than 1 pill every 4-6 hours.  If you still have uncontrolled pain after using all opioid pills, don't hesitate to call our staff using the number provided. We will help make sure you are managing your pain in the best way possible, and if necessary, we can provide a prescription for additional pain medication.   Day 1    Time  Name of Medication Number of pills taken  Amount of Acetaminophen  Pain Level   Comments  AM PM       AM PM       AM PM       AM PM       AM PM       AM PM       AM PM       AM PM       Total Daily amount of Acetaminophen Do not  take more than  3,000 mg per day      Day 2    Time  Name of Medication Number of pills taken  Amount of Acetaminophen  Pain Level   Comments  AM PM       AM PM       AM PM       AM PM       AM PM       AM PM       AM PM       AM PM       Total Daily amount of Acetaminophen Do not take more than  3,000 mg per day      Day 3    Time  Name of Medication Number of pills taken  Amount of Acetaminophen  Pain Level   Comments  AM PM       AM PM       AM PM       AM PM          AM PM       AM PM       AM PM       AM PM       Total Daily amount of Acetaminophen Do not take more than  3,000 mg per day      Day 4    Time  Name of Medication Number of pills taken  Amount of Acetaminophen  Pain Level   Comments  AM PM       AM PM       AM PM       AM PM       AM PM       AM PM       AM PM       AM PM       Total Daily amount of Acetaminophen Do not take more than  3,000 mg per day      Day 5    Time  Name of Medication Number of pills taken  Amount of Acetaminophen  Pain Level   Comments  AM PM       AM PM       AM   PM       AM PM       AM PM       AM PM       AM PM       AM PM       Total Daily amount of Acetaminophen Do not take more than  3,000 mg per day       Day 6    Time  Name of Medication Number of pills taken  Amount of Acetaminophen  Pain Level  Comments  AM PM       AM PM       AM PM       AM PM       AM PM       AM PM       AM PM       AM PM       Total Daily amount of Acetaminophen Do not take more than  3,000 mg per day      Day 7    Time  Name of Medication Number of pills taken  Amount of Acetaminophen  Pain Level   Comments  AM PM       AM PM       AM PM       AM PM       AM PM       AM PM       AM PM       AM PM       Total Daily amount of Acetaminophen Do not take more than  3,000 mg per day        For additional information about how and where to safely dispose of unused  opioid medications - https://www.morepowerfulnc.org  Disclaimer: This document contains information and/or instructional materials adapted from [facility] for the typical patient with your condition. It does not replace medical advice from your health care provider because your experience may differ from that of the typical patient. Talk to your health care provider if you have any questions about this document, your condition or your treatment plan. Adapted from [facility]   

## 2018-04-20 NOTE — Plan of Care (Signed)
Care plans initiated and education on Use of sling, ice to assist with discomfort, call bell use for safety

## 2018-04-20 NOTE — Discharge Summary (Signed)
    Patient ID: Ryan Montes [id number] 1981/01/06 38 y.o.  Admit date: 04/19/2018 Discharge date: 04/20/2018  Admitting Diagnosis: Acute appendicitis  Discharge Diagnosis Patient Active Problem List   Diagnosis Date Noted  . Appendicitis, acute 04/19/2018  . Acute appendicitis 04/19/2018    Consultants None  Reason for Admission: Patient is a 38 year old male who developed generalized central abdominal pain.  This was associated with onset of anorexia.  He denies fevers or chills.  He denies nausea or vomiting.  Pain became more severe and localized to the right lower quadrant.  He was seen at an urgent care and referred to [facility] for CT scan.  By report, the patient had acute appendicitis and was sent to the emergency department at [facility].  Unfortunately, records from the urgent care and from the radiologic facility are unavailable for review.  Repeat laboratory studies did show a mildly elevated white blood cell count.  Patient gives a good clinical history for acute appendicitis.  Pain is exacerbated with physical activity and walking.  Pain is increased by going down the steps.  Patient has had no prior abdominal surgery.  Procedures Laparoscopic appendectomy  Hospital Course:  The patient was admitted and underwent a laparoscopic appendectomy.  The patient tolerated the procedure well.  On POD 1, the patient was tolerating a regular diet, voiding well, mobilizing, and pain was controlled with oral pain medications.  The patient was stable for DC home at this time with appropriate follow up made.  Physical Exam: Gen: Awake and alert. WD, WN. NAD Heart: RRR Lungs: CTA b/l Abd: Soft, ND. Appropriately tender. +BS.  Incisions c/d/i.  MSK: No edema  Allergies as of 04/20/2018      Reactions   Augmentin [amoxicillin-pot Clavulanate] Hives   Suprax [cefixime] Hives      Medication List    STOP taking these medications   doxycycline 100 MG capsule Commonly  known as:  VIBRAMYCIN   meloxicam 15 MG tablet Commonly known as:  MOBIC     TAKE these medications   multivitamin with minerals Tabs tablet Take 1 tablet by mouth daily.   ondansetron 4 MG disintegrating tablet Commonly known as:  ZOFRAN-ODT Take 1 tablet (4 mg total) by mouth every 6 (six) hours as needed for nausea.   oxyCODONE 5 MG immediate release tablet Commonly known as:  Oxy IR/ROXICODONE Take 1 tablet (5 mg total) by mouth every 6 (six) hours as needed for moderate pain.   polyethylene glycol packet Commonly known as:  MIRALAX / GLYCOLAX Take 17 g by mouth daily as needed for mild constipation or moderate constipation.        Follow-up Information    [facility] Follow up on 05/03/2018.   Specialty:  General Surgery Why:  Your appointment is at 1:45 PM. Please arrive 30 minutes before your appointment to fill out paperwork. Please bring a copy of your ID and insurance card.  Contact information: [address] [place] [address] [phone]           Signed: Leary Roca, [facility] 04/20/2018, 12:05 PM Pager: [phone]

## 2018-04-20 NOTE — Progress Notes (Signed)
Patient ID: Ryan RocheKyle Muzzy, male   DOB: 12/24/1980, 38 y.o.   MRN: [id number]    1 Day Post-Op  Subjective: CC: Abdominal Pain Reports some soreness around incisions sites. Pain controlled with oral pain medication. Tolerating clears. No N/V. Denies flatus or BM. Voiding.   Objective: Vital signs in last 24 hours: Temp:  [97.5 F (36.4 C)-98.5 F (36.9 C)] 97.5 F (36.4 C) (01/24 0706) Pulse Rate:  [56-93] 56 (01/24 0706) Resp:  [15-21] 15 (01/24 0706) BP: (124-158)/(68-92) 142/79 (01/24 0706) SpO2:  [97 %-100 %] 99 % (01/24 0706) Weight:  [113.4 kg] 113.4 kg (01/24 0227) Last BM Date: 04/19/18  Intake/Output from previous day: 01/23 0701 - 01/24 0700 In: 1761 [I.V.:1025; IV Piggyback:500] Out: 1160 [Urine:1150; Blood:10] Intake/Output this shift: No intake/output data recorded.  PE: Gen: Awake and alert. WD, WN. NAD Heart: RRR Lungs: CTA b/l Abd: Soft, ND. Appropriately tender. +BS.  Incisions c/d/i.  MSK: No edema  Lab Results:  Recent Labs    04/19/18 1753  WBC 11.1*  HGB 13.8  HCT 43.2  PLT 219   BMET Recent Labs    04/19/18 1753  NA 138  K 3.7  CL 105  CO2 23  GLUCOSE 98  BUN 13  CREATININE 0.86  CALCIUM 9.3   PT/INR No results for input(s): LABPROT, INR in the last 72 hours. CMP     Component Value Date/Time   NA 138 04/19/2018 1753   K 3.7 04/19/2018 1753   CL 105 04/19/2018 1753   CO2 23 04/19/2018 1753   GLUCOSE 98 04/19/2018 1753   BUN 13 04/19/2018 1753   CREATININE 0.86 04/19/2018 1753   CALCIUM 9.3 04/19/2018 1753   PROT 8.1 04/19/2018 1753   ALBUMIN 4.5 04/19/2018 1753   AST 29 04/19/2018 1753   ALT 33 04/19/2018 1753   ALKPHOS 68 04/19/2018 1753   BILITOT 0.8 04/19/2018 1753   GFRNONAA >60 04/19/2018 1753   GFRAA >60 04/19/2018 1753   Lipase     Component Value Date/Time   LIPASE 32 04/19/2018 1753       Studies/Results: No results found.  Anti-infectives: Anti-infectives (From admission, onward)   Start      Dose/Rate Route Frequency Ordered Stop   04/20/18 1000  ciprofloxacin (CIPRO) IVPB 400 mg     400 mg 200 mL/hr over 60 Minutes Intravenous Every 12 hours 04/19/18 2319     04/20/18 0500  metroNIDAZOLE (FLAGYL) IVPB 500 mg     500 mg 100 mL/hr over 60 Minutes Intravenous Every 8 hours 04/19/18 2319     04/19/18 2100  ciprofloxacin (CIPRO) IVPB 400 mg     400 mg 200 mL/hr over 60 Minutes Intravenous On call to O.R. 04/19/18 2042 04/19/18 2150   04/19/18 2100  metroNIDAZOLE (FLAGYL) IVPB 500 mg     500 mg 100 mL/hr over 60 Minutes Intravenous On call to O.R. 04/19/18 2042 04/19/18 2123   04/19/18 2045  ciprofloxacin (CIPRO) 400 MG/200ML IVPB    Note to Pharmacy:  Yevonne PaxPulliam, Elizabeth  : cabinet override      04/19/18 2045 04/19/18 2108   04/19/18 2045  metroNIDAZOLE (FLAGYL) 5-0.79 MG/ML-% IVPB    Note to Pharmacy:  Yevonne PaxPulliam, Elizabeth  : cabinet override      04/19/18 2045 04/19/18 2108       Assessment/Plan POD 1, s/p Laparoscopic Appendectomy by Dr. Darnell Levelodd Gerkin, 1/23 -He is voiding well -Pain controlled with oral pain medication -Advance diet to soft -Mobilize & IS  FEN - Soft VTE - SCD ID - Cipro & Flagyl 1/23 >>  Plan: Mobilize patient in halls, advance diet to soft. If mobilizes well & tolerates diet expect discharge home later today.    LOS: 0 days    Jacinto HalimMichael M Adyson Vanburen , [facility] 04/20/2018, 8:18 AM Pager: [phone]

## 2018-11-28 ENCOUNTER — Encounter: Payer: Self-pay | Admitting: Family Medicine

## 2018-11-28 ENCOUNTER — Ambulatory Visit (INDEPENDENT_AMBULATORY_CARE_PROVIDER_SITE_OTHER): Payer: Managed Care, Other (non HMO) | Admitting: Family Medicine

## 2018-11-28 ENCOUNTER — Other Ambulatory Visit: Payer: Self-pay

## 2018-11-28 VITALS — BP 110/72 | HR 61 | Temp 98.1°F | Ht 75.25 in | Wt 252.2 lb

## 2018-11-28 DIAGNOSIS — Z0001 Encounter for general adult medical examination with abnormal findings: Secondary | ICD-10-CM

## 2018-11-28 DIAGNOSIS — Z6831 Body mass index (BMI) 31.0-31.9, adult: Secondary | ICD-10-CM

## 2018-11-28 DIAGNOSIS — Z131 Encounter for screening for diabetes mellitus: Secondary | ICD-10-CM

## 2018-11-28 DIAGNOSIS — M79641 Pain in right hand: Secondary | ICD-10-CM | POA: Insufficient documentation

## 2018-11-28 DIAGNOSIS — E669 Obesity, unspecified: Secondary | ICD-10-CM

## 2018-11-28 DIAGNOSIS — M79642 Pain in left hand: Secondary | ICD-10-CM | POA: Insufficient documentation

## 2018-11-28 DIAGNOSIS — Z13220 Encounter for screening for lipoid disorders: Secondary | ICD-10-CM

## 2018-11-28 LAB — COMPREHENSIVE METABOLIC PANEL
ALT: 34 U/L (ref 0–53)
AST: 27 U/L (ref 0–37)
Albumin: 4.6 g/dL (ref 3.5–5.2)
Alkaline Phosphatase: 82 U/L (ref 39–117)
BUN: 11 mg/dL (ref 6–23)
CO2: 28 mEq/L (ref 19–32)
Calcium: 9.7 mg/dL (ref 8.4–10.5)
Chloride: 103 mEq/L (ref 96–112)
Creatinine, Ser: 0.91 mg/dL (ref 0.40–1.50)
GFR: 94.1 mL/min (ref >60.00)
Glucose, Bld: 92 mg/dL (ref 70–99)
Potassium: 4.1 mEq/L (ref 3.5–5.1)
Sodium: 140 mEq/L (ref 135–145)
Total Bilirubin: 0.7 mg/dL (ref 0.2–1.2)
Total Protein: 7.5 g/dL (ref 6.0–8.3)

## 2018-11-28 LAB — CBC
HCT: 44.5 % (ref 39.0–52.0)
Hemoglobin: 14.8 g/dL (ref 13.0–17.0)
MCHC: 33.2 g/dL (ref 30.0–36.0)
MCV: 92.8 fl (ref 78.0–100.0)
Platelets: 215 10*3/uL (ref 150.0–400.0)
RBC: 4.8 Mil/uL (ref 4.22–5.81)
RDW: 13.9 % (ref 11.5–15.5)
WBC: 5.8 10*3/uL (ref 4.0–10.5)

## 2018-11-28 LAB — LIPID PANEL
Cholesterol: 162 mg/dL (ref 0–200)
HDL: 46.9 mg/dL (ref >39.00)
LDL Cholesterol: 98 mg/dL (ref 0–99)
NonHDL: 114.84
Total CHOL/HDL Ratio: 3
Triglycerides: 86 mg/dL (ref 0.0–149.0)
VLDL: 17.2 mg/dL (ref 0.0–40.0)

## 2018-11-28 LAB — HEMOGLOBIN A1C: Hgb A1c MFr Bld: 5.4 % (ref 4.6–6.5)

## 2018-11-28 LAB — TSH: TSH: 0.81 u[IU]/mL (ref 0.35–4.50)

## 2018-11-28 NOTE — Patient Instructions (Signed)
It was very nice to see you today!  Please try using Voltaren for your hands.  This is available over-the-counter.  We will check blood work today.  Come back to see me in 1 year for your next physical with blood work, or sooner if needed.  Take care, Dr Jerline Pain  Please try these tips to maintain a healthy lifestyle:   Eat at least 3 REAL meals and 1-2 snacks per day.  Aim for no more than 5 hours between eating.  If you eat breakfast, please do so within one hour of getting up.    Obtain twice as many fruits/vegetables as protein or carbohydrate foods for both lunch and dinner. (Half of each meal should be fruits/vegetables, one quarter protein, and one quarter starchy carbs)   Cut down on sweet beverages. This includes juice, soda, and sweet tea.    Exercise at least 150 minutes every week.    Preventive Care 59-35 Years Old, Male Preventive care refers to lifestyle choices and visits with your health care provider that can promote health and wellness. This includes:  A yearly physical exam. This is also called an annual well check.  Regular dental and eye exams.  Immunizations.  Screening for certain conditions.  Healthy lifestyle choices, such as eating a healthy diet, getting regular exercise, not using drugs or products that contain nicotine and tobacco, and limiting alcohol use. What can I expect for my preventive care visit? Physical exam Your health care provider will check:  Height and weight. These may be used to calculate body mass index (BMI), which is a measurement that tells if you are at a healthy weight.  Heart rate and blood pressure.  Your skin for abnormal spots. Counseling Your health care provider may ask you questions about:  Alcohol, tobacco, and drug use.  Emotional well-being.  Home and relationship well-being.  Sexual activity.  Eating habits.  Work and work Statistician. What immunizations do I need?  Influenza (flu) vaccine   This is recommended every year. Tetanus, diphtheria, and pertussis (Tdap) vaccine  You may need a Td booster every 10 years. Varicella (chickenpox) vaccine  You may need this vaccine if you have not already been vaccinated. Human papillomavirus (HPV) vaccine  If recommended by your health care provider, you may need three doses over 6 months. Measles, mumps, and rubella (MMR) vaccine  You may need at least one dose of MMR. You may also need a second dose. Meningococcal conjugate (MenACWY) vaccine  One dose is recommended if you are 38-25 years old and a Market researcher living in a residence hall, or if you have one of several medical conditions. You may also need additional booster doses. Pneumococcal conjugate (PCV13) vaccine  You may need this if you have certain conditions and were not previously vaccinated. Pneumococcal polysaccharide (PPSV23) vaccine  You may need one or two doses if you smoke cigarettes or if you have certain conditions. Hepatitis A vaccine  You may need this if you have certain conditions or if you travel or work in places where you may be exposed to hepatitis A. Hepatitis B vaccine  You may need this if you have certain conditions or if you travel or work in places where you may be exposed to hepatitis B. Haemophilus influenzae type b (Hib) vaccine  You may need this if you have certain risk factors. You may receive vaccines as individual doses or as more than one vaccine together in one shot (combination vaccines). Talk with  your health care provider about the risks and benefits of combination vaccines. What tests do I need? Blood tests  Lipid and cholesterol levels. These may be checked every 5 years starting at age 27.  Hepatitis C test.  Hepatitis B test. Screening   Diabetes screening. This is done by checking your blood sugar (glucose) after you have not eaten for a while (fasting).  Sexually transmitted disease (STD) testing.  Talk with your health care provider about your test results, treatment options, and if necessary, the need for more tests. Follow these instructions at home: Eating and drinking   Eat a diet that includes fresh fruits and vegetables, whole grains, lean protein, and low-fat dairy products.  Take vitamin and mineral supplements as recommended by your health care provider.  Do not drink alcohol if your health care provider tells you not to drink.  If you drink alcohol: ? Limit how much you have to 0-2 drinks a day. ? Be aware of how much alcohol is in your drink. In the [place] one drink equals one 12 oz bottle of beer (355 mL), one 5 oz glass of wine (148 mL), or one 1 oz glass of hard liquor (44 mL). Lifestyle  Take daily care of your teeth and gums.  Stay active. Exercise for at least 30 minutes on 5 or more days each week.  Do not use any products that contain nicotine or tobacco, such as cigarettes, e-cigarettes, and chewing tobacco. If you need help quitting, ask your health care provider.  If you are sexually active, practice safe sex. Use a condom or other form of protection to prevent STIs (sexually transmitted infections). What's next?  Go to your health care provider once a year for a well check visit.  Ask your health care provider how often you should have your eyes and teeth checked.  Stay up to date on all vaccines. This information is not intended to replace advice given to you by your health care provider. Make sure you discuss any questions you have with your health care provider. Document Released: 05/10/2001 Document Revised: 03/08/2018 Document Reviewed: 03/08/2018 Elsevier Patient Education  2020 [employer].

## 2018-11-28 NOTE — Progress Notes (Signed)
Chief Complaint:  Ryan Montes is a 38 y.o. male who presents today for his annual comprehensive physical exam and to establish care.  Assessment/Plan:  Bilateral hand pain Likely osteoarthritis.  Recommended over-the-counter Voltaren gel.  He can also use Tylenol as needed.  Body mass index is 31.32 kg/m. / Obese BMI Metric Follow Up - 11/28/18 1035      BMI Metric Follow Up-Please document annually   BMI Metric Follow Up  Education provided       Preventative Healthcare: Check CBC, C met, TSH, A1c, and lipid panel.  Patient Counseling(The following topics were reviewed and/or handout was given):  -Nutrition: Stressed importance of moderation in sodium/caffeine intake, saturated fat and cholesterol, caloric balance, sufficient intake of fresh fruits, vegetables, and fiber.  -Stressed the importance of regular exercise.   -Substance Abuse: Discussed cessation/primary prevention of tobacco, alcohol, or other drug use; driving or other dangerous activities under the influence; availability of treatment for abuse.   -Injury prevention: Discussed safety belts, safety helmets, smoke detector, smoking near bedding or upholstery.   -Sexuality: Discussed sexually transmitted diseases, partner selection, use of condoms, avoidance of unintended pregnancy and contraceptive alternatives.   -Dental health: Discussed importance of regular tooth brushing, flossing, and dental visits.  -Health maintenance and immunizations reviewed. Please refer to Health maintenance section.  Return to care in 1 year for next preventative visit.     Subjective:  HPI:  He has no acute complaints today.   He has had several year history of bilateral hand pain.  Thinks he has arthritis.  Usually improves with stretching and Tylenol.  Usually occurs every morning.  Lifestyle Diet: No specific diets or eating plans. Tries to eat plenty of fruits and vegetables. Tries to limit amount of of meat.  Exercise:  Likes to play flag football - plays at a national level. Likes to play in several rec leagues.   Depression screen PHQ 2/9 11/28/2018  Decreased Interest 0  Down, Depressed, Hopeless 0  PHQ - 2 Score 0    Health Maintenance Due  Topic Date Due  . HIV Screening  07/30/1997     ROS: Per HPI, otherwise a complete review of systems was negative.   PMH:  The following were reviewed and entered/updated in epic: History reviewed. No pertinent past medical history. Patient Active Problem List   Diagnosis Date Noted  . Bilateral hand pain 11/28/2018   Past Surgical History:  Procedure Laterality Date  . FRACTURE SURGERY    . LAPAROSCOPIC APPENDECTOMY N/A 04/19/2018   Procedure: APPENDECTOMY LAPAROSCOPIC;  Surgeon: Armandina Gemma, MD;  Location: [facility];  Service: General;  Laterality: N/A;    Family History  Problem Relation Age of Onset  . Cancer Mother        rectal  . Diabetes Mother   . Diabetes Maternal Grandfather     Medications- reviewed and updated Current Outpatient Medications  Medication Sig Dispense Refill  . acetaminophen (TYLENOL) 500 MG tablet Take 500 mg by mouth every 6 (six) hours as needed.     No current facility-administered medications for this visit.     Allergies-reviewed and updated Allergies  Allergen Reactions  . Augmentin [Amoxicillin-Pot Clavulanate] Hives  . Suprax [Cefixime] Hives    Social History   Socioeconomic History  . Marital status: Married    Spouse name: Not on file  . Number of children: Not on file  . Years of education: Not on file  . Highest education level: Not on file  Occupational History  . Not on file  Social Needs  . Financial resource strain: Not on file  . Food insecurity    Worry: Not on file    Inability: Not on file  . Transportation needs    Medical: Not on file    Non-medical: Not on file  Tobacco Use  . Smoking status: Never Smoker  . Smokeless tobacco: Never Used  Substance and Sexual Activity  .  Alcohol use: Yes    Alcohol/week: 10.0 standard drinks    Types: 10 Standard drinks or equivalent per week  . Drug use: Never  . Sexual activity: Not on file  Lifestyle  . Physical activity    Days per week: Not on file    Minutes per session: Not on file  . Stress: Not on file  Relationships  . Social Herbalist on phone: Not on file    Gets together: Not on file    Attends religious service: Not on file    Active member of club or organization: Not on file    Attends meetings of clubs or organizations: Not on file    Relationship status: Not on file  Other Topics Concern  . Not on file  Social History Narrative  . Not on file        Objective:  Physical Exam: BP 110/72   Pulse 61   Temp 98.1 F (36.7 C)   Ht 6' 3.25" (1.911 m)   Wt 252 lb 4 oz (114.4 kg)   SpO2 97%   BMI 31.32 kg/m   Body mass index is 31.32 kg/m. Wt Readings from Last 3 Encounters:  11/28/18 252 lb 4 oz (114.4 kg)  04/20/18 250 lb (113.4 kg)  04/03/15 235 lb 9.6 oz (106.9 kg)  Gen: NAD, resting comfortably HEENT: TMs normal bilaterally. OP clear. No thyromegaly noted.  CV: RRR with no murmurs appreciated Pulm: NWOB, CTAB with no crackles, wheezes, or rhonchi GI: Normal bowel sounds present. Soft, Nontender, Nondistended. MSK: no edema, cyanosis, or clubbing noted Skin: warm, dry Neuro: CN2-12 grossly intact. Strength 5/5 in upper and lower extremities. Reflexes symmetric and intact bilaterally.  Psych: Normal affect and thought content     Akita Maxim M. Jerline Pain, MD 11/28/2018 10:36 AM

## 2018-11-28 NOTE — Assessment & Plan Note (Signed)
Likely osteoarthritis.  Recommended over-the-counter Voltaren gel.  He can also use Tylenol as needed.

## 2018-11-29 NOTE — Progress Notes (Signed)
Please inform patient of the following:  Blood work is all NORMAL. We can complete his form that work requested. Would like for him to keep up the good work and we can recheck in a few years.  Ryan Montes. Jerline Pain, MD 11/29/2018 10:10 AM

## 2019-02-03 ENCOUNTER — Encounter: Payer: Self-pay | Admitting: Family Medicine

## 2019-02-04 ENCOUNTER — Encounter: Payer: Self-pay | Admitting: Family Medicine

## 2019-02-04 ENCOUNTER — Ambulatory Visit (INDEPENDENT_AMBULATORY_CARE_PROVIDER_SITE_OTHER): Payer: Managed Care, Other (non HMO) | Admitting: Family Medicine

## 2019-02-04 ENCOUNTER — Other Ambulatory Visit: Payer: Self-pay

## 2019-02-04 ENCOUNTER — Ambulatory Visit (INDEPENDENT_AMBULATORY_CARE_PROVIDER_SITE_OTHER): Payer: Managed Care, Other (non HMO)

## 2019-02-04 VITALS — BP 118/68 | HR 65 | Temp 98.0°F | Ht 75.25 in | Wt 253.5 lb

## 2019-02-04 DIAGNOSIS — M79672 Pain in left foot: Secondary | ICD-10-CM | POA: Diagnosis not present

## 2019-02-04 NOTE — Progress Notes (Signed)
   Chief Complaint:  Ryan Montes is a 38 y.o. male who presents today with a chief complaint of foot pain.   Assessment/Plan:  Foot Pain Plain films with fracture at base of fifth metatarsal based on my read.  Will place in postop shoe.  Recommended continuing ice, elevation, and anti-inflammatories.  Recommended limited weightbearing for the next couple weeks.  He will follow-up with sports medicine in a couple weeks.  Discussed reasons return to care earlier.      Subjective:  HPI:  Foot Pain Started few days ago.  He was playing flag football when he landed on the outer aspect of his left foot.  Never felt a pop.  Had some pain and swelling to the area.  Has tried Tylenol and ibuprofen which is helped modestly.  Is also used a walking boot that he had from a previous injury.  Is also been icing his foot for about 30 min/day.  Symptoms are improving modestly.  Pain is worse with walking.  No other obvious aggravating or alleviating factors.   ROS: Per HPI  PMH: He reports that he has never smoked. He has never used smokeless tobacco. He reports current alcohol use of about 10.0 standard drinks of alcohol per week. He reports that he does not use drugs.      Objective:  Physical Exam: Pulse 65   Temp 98 F (36.7 C)   Ht 6' 3.25" (1.911 m)   Wt 253 lb 8 oz (115 kg)   SpO2 99%   BMI 31.48 kg/m   Gen: NAD, resting comfortably MSK: -Left foot: No deformities.  Tender to palpation along base of fifth metatarsal.  Neurovascular intact distally.  Time Spent: I spent >25 minutes face-to-face with the patient, with more than half spent on counseling for management plan for his foot pain / metatarsal fracture.      Algis Greenhouse. Jerline Pain, MD 02/04/2019 8:48 AM

## 2019-02-04 NOTE — Patient Instructions (Signed)
It was very nice to see you today!  You have a spinal fracture in your foot.  Please use the postop shoe as much as possible.  Please continue using ice, Tylenol, and ibuprofen.  Please avoid bearing weight as much as possible.   Please come back in a couple of weeks to see Dr. Georgina Snell to make sure it is healing normally.   Take care, Dr Jerline Pain  Please try these tips to maintain a healthy lifestyle:   Eat at least 3 REAL meals and 1-2 snacks per day.  Aim for no more than 5 hours between eating.  If you eat breakfast, please do so within one hour of getting up.    Obtain twice as many fruits/vegetables as protein or carbohydrate foods for both lunch and dinner. (Half of each meal should be fruits/vegetables, one quarter protein, and one quarter starchy carbs)   Cut down on sweet beverages. This includes juice, soda, and sweet tea.    Exercise at least 150 minutes every week.

## 2019-02-04 NOTE — Progress Notes (Signed)
Please inform patient of the following:  Xray confirmed a fracture. They also found an additional spot in his navicular bone. Would like for him to follow up with sports medicine as we discussed.  Ryan Montes. Jerline Pain, MD 02/04/2019 3:05 PM

## 2019-02-06 ENCOUNTER — Other Ambulatory Visit: Payer: Self-pay

## 2019-02-06 DIAGNOSIS — S92353D Displaced fracture of fifth metatarsal bone, unspecified foot, subsequent encounter for fracture with routine healing: Secondary | ICD-10-CM

## 2019-02-06 DIAGNOSIS — M79672 Pain in left foot: Secondary | ICD-10-CM

## 2019-02-07 ENCOUNTER — Ambulatory Visit (INDEPENDENT_AMBULATORY_CARE_PROVIDER_SITE_OTHER): Payer: Managed Care, Other (non HMO) | Admitting: Family Medicine

## 2019-02-07 ENCOUNTER — Encounter: Payer: Self-pay | Admitting: Family Medicine

## 2019-02-07 VITALS — BP 120/78 | HR 73 | Ht 75.25 in | Wt 252.4 lb

## 2019-02-07 DIAGNOSIS — S92355A Nondisplaced fracture of fifth metatarsal bone, left foot, initial encounter for closed fracture: Secondary | ICD-10-CM | POA: Diagnosis not present

## 2019-02-07 NOTE — Progress Notes (Signed)
Subjective:    I'm seeing this patient as a consultation for:  Ardith Dark, MD   CC: Left foot fracture  I, Christoper Fabian, LAT, ATC, am serving as scribe for Dr. Clementeen Graham.  HPI: Neythan suffered a left foot injury last week playing flag football.  He landed on the lateral border of his L foot and heard a pop followed by immediate pain and swelling.  He developed pain and swelling and was seen by his PCP on November 9 where he was diagnosed with a metaphysis to diaphysis proximal fifth metatarsal fracture.  He was treated with postop shoe.  Additionally x-ray shows circumcised prominent lucency in navicular.  Radiology did recommend potential MRI for further evaluation.  Since he was seen Ronaldo Miyamoto notes his L foot pain has improved slightly and notes sharp 8/10 pain at it's worst.  Pt has been using ice, IBU and has been wearing a post-op shoe and advised to limit his weight-bearing as much as possible.  He notes that weight bearing aggravates his symptoms.     Past medical history, Surgical history, Family history not pertinant except as noted below, Social history, Allergies, and medications have been entered into the medical record, reviewed, and no changes needed.   Review of Systems: No headache, visual changes, nausea, vomiting, diarrhea, constipation, dizziness, abdominal pain, skin rash, fevers, chills, night sweats, weight loss, swollen lymph nodes, body aches, joint swelling, muscle aches, chest pain, shortness of breath, mood changes, visual or auditory hallucinations.   Objective:    Vitals:   02/07/19 0802  BP: 120/78  Pulse: 73  SpO2: 95%   General: Well Developed, well nourished, and in no acute distress.  Neuro/Psych: Alert and oriented x3, extra-ocular muscles intact, able to move all 4 extremities, sensation grossly intact. Skin: Warm and dry, no rashes noted.  Respiratory: Not using accessory muscles, speaking in full sentences, trachea midline.  Cardiovascular: Pulses  palpable, no extremity edema. Abdomen: Does not appear distended. MSK: Left foot going using postop shoe.  Not particularly painful gait.  Normal ankle motion.  Lab and Radiology Results No results found for this or any previous visit (from the past 72 hour(s)). Dg Foot Complete Left  Result Date: 02/04/2019 CLINICAL DATA:  Left foot pain. EXAM: LEFT FOOT - COMPLETE 3+ VIEW COMPARISON:  No recent. FINDINGS: Diffuse degenerative change left foot. Nondisplaced fracture of the base of the left fifth metatarsal noted. Prominent well-circumscribed lucency noted in the navicula noted only on one view. Further evaluation with MRI of the left foot should be considered. No other focal abnormality identified. Corticated bony density noted adjacent to the medial malleolus consistent with old fracture fragment. IMPRESSION: 1.  Nondisplaced fracture of the base of the left fifth metatarsal. 2. Well-circumscribed prominent lucency noted in the navicula, noted only on one view. Further evaluation with of the left foot MRI should be considered. Electronically Signed   By: Maisie Fus  Register   On: 02/04/2019 09:27  I, Clementeen Graham, personally (independently) visualized and performed the interpretation of the images attached in this note.   Impression and Recommendations:    Assessment and Plan: 38 y.o. male with left proximal fifth metatarsal fracture.  Near watershed Jones fracture area.  Plan to transition to cam walker boot with minimized weightbearing.  Check back in about 2 weeks.  Lucency at navicular unclear etiology.  Will reassess with repeat x-ray in about 2 weeks.  If needed MRI will proceed with further evaluation.Marland Kitchen  PDMP not reviewed  this encounter. No orders of the defined types were placed in this encounter.  No orders of the defined types were placed in this encounter.   Discussed warning signs or symptoms. Please see discharge instructions. Patient expresses understanding.  The above  documentation has been reviewed and is accurate and complete Lynne Leader

## 2019-02-07 NOTE — Progress Notes (Signed)
Note duplication error. MW

## 2019-02-07 NOTE — Patient Instructions (Addendum)
Thank you for coming in today. Transition to Cam Walker Minimize weight if having pain or swelling.  Recheck in about 2 weeks.   I am worried about a Jones fracture.

## 2019-02-18 ENCOUNTER — Ambulatory Visit: Payer: Managed Care, Other (non HMO) | Admitting: Family Medicine

## 2019-02-25 ENCOUNTER — Encounter: Payer: Self-pay | Admitting: Family Medicine

## 2019-02-25 ENCOUNTER — Other Ambulatory Visit: Payer: Self-pay

## 2019-02-25 ENCOUNTER — Ambulatory Visit (INDEPENDENT_AMBULATORY_CARE_PROVIDER_SITE_OTHER): Payer: Managed Care, Other (non HMO) | Admitting: Family Medicine

## 2019-02-25 VITALS — BP 108/72 | HR 67 | Ht 75.25 in | Wt 258.2 lb

## 2019-02-25 DIAGNOSIS — S92355A Nondisplaced fracture of fifth metatarsal bone, left foot, initial encounter for closed fracture: Secondary | ICD-10-CM | POA: Diagnosis not present

## 2019-02-25 NOTE — Progress Notes (Signed)
I, Wendy Poet, LAT, ATC, am serving as scribe for Dr. Lynne Leader.  Ryan Montes is a 38 y.o. male who presents to [facility] today for f/u of L 5th MT fracture.  Pt was last seen on 02/07/19 and was advised to transition to a cam walker boot instead of using his post-op shoe.  He was also advised to limit his weight-bearing.  Over the last 2 weeks, he reports improvement in his symptoms.  He rates his pain at a 5/10 on average and notes slightly more pain/soreness first thing in the morning and at the end of the day.  He con't take IBU daily.      ROS:  As above  Exam:  BP 108/72 (BP Location: Left Arm, Patient Position: Sitting, Cuff Size: Large)   Pulse 67   Ht 6' 3.25" (1.911 m)   Wt 258 lb 3.2 oz (117.1 kg)   SpO2 96%   BMI 32.06 kg/m  Wt Readings from Last 5 Encounters:  02/25/19 258 lb 3.2 oz (117.1 kg)  02/07/19 252 lb 6.4 oz (114.5 kg)  02/04/19 253 lb 8 oz (115 kg)  11/28/18 252 lb 4 oz (114.4 kg)  04/20/18 250 lb (113.4 kg)   General: Well Developed, well nourished, and in no acute distress.  Neuro/Psych: Alert and oriented x3, extra-ocular muscles intact, able to move all 4 extremities, sensation grossly intact. Skin: Warm and dry, no rashes noted.  Respiratory: Not using accessory muscles, speaking in full sentences, trachea midline.  Cardiovascular: Pulses palpable, no extremity edema. Abdomen: Does not appear distended. MSK: Left foot: Nonpainful gait with cam walker bed    Lab and Radiology Results X-ray left foot ordered will be done tomorrow.    Assessment and Plan: 38 y.o. male with left fifth metatarsal fracture.  Fracture is in a somewhat gray area between the [place] area and metatarsal shaft.  Based on his improved pain response with appropriate cam walker boot from optimistic.  Plan to continue cam walker boot and recheck in 2 weeks.  Get x-ray tomorrow.  Of note x-ray x-ray is not available at my location today but should  be available tomorrow.  Patient chose to return tomorrow or the next day to get an x-ray.  Obviously plan may change based on x-ray results.  I spent 15 minutes with this patient, greater than 50% was face-to-face time counseling regarding treatment plan neck steps and options.Marland Kitchen    PDMP not reviewed this encounter. Orders Placed This Encounter  Procedures  . DG Foot Complete Left    Standing Status:   Future    Standing Expiration Date:   04/26/2020    Order Specific Question:   Reason for Exam (SYMPTOM  OR DIAGNOSIS REQUIRED)    Answer:   follow up 5th metatarsal fracture    Order Specific Question:   Preferred imaging location?    Answer:   [facility]    Order Specific Question:   Radiology Contrast Protocol - do NOT remove file path    Answer:   [email or web address]   No orders of the defined types were placed in this encounter.   Historical information moved to improve visibility of documentation.  No past medical history on file. Past Surgical History:  Procedure Laterality Date  . FRACTURE SURGERY    . LAPAROSCOPIC APPENDECTOMY N/A 04/19/2018   Procedure: APPENDECTOMY LAPAROSCOPIC;  Surgeon: Armandina Gemma, MD;  Location: [facility];  Service: General;  Laterality: N/A;   Social History  Tobacco Use  . Smoking status: Never Smoker  . Smokeless tobacco: Never Used  Substance Use Topics  . Alcohol use: Yes    Alcohol/week: 10.0 standard drinks    Types: 10 Standard drinks or equivalent per week   family history includes Cancer in his mother; Diabetes in his maternal grandfather and mother.  Medications: Current Outpatient Medications  Medication Sig Dispense Refill  . ibuprofen (ADVIL) 200 MG tablet Take 200 mg by mouth 3 (three) times daily.    Marland Kitchen acetaminophen (TYLENOL) 500 MG tablet Take 500 mg by mouth every 6 (six) hours as needed.     No current facility-administered medications for this visit.    Allergies   Allergen Reactions  . Augmentin [Amoxicillin-Pot Clavulanate] Hives  . Suprax [Cefixime] Hives      Discussed warning signs or symptoms. Please see discharge instructions. Patient expresses understanding.  The above documentation has been reviewed and is accurate and complete Clementeen Graham

## 2019-02-25 NOTE — Patient Instructions (Addendum)
Thank you for coming in today. Get xray like we talked about.  Continue the boot with limited weightbearing.  We will contact you with xray results.  Recheck in about 2 weeks.   We're moving!  Dr. Clovis Riley new office will be located at [address] on the 1st floor.  This location is across the street from the [employer] and in the same complex as the [facility] and [employer]  Our new office phone number will be [phone].  We anticipate beginning to see patients at the [facility] office in early December 2020.  Return sooner if needed.   Schedule xray.   Get a Steel [place]f Toe insole.  Do a Producer, television/film/video for [employer]

## 2019-02-26 ENCOUNTER — Other Ambulatory Visit: Payer: Managed Care, Other (non HMO)

## 2019-02-26 ENCOUNTER — Ambulatory Visit (INDEPENDENT_AMBULATORY_CARE_PROVIDER_SITE_OTHER): Payer: Managed Care, Other (non HMO)

## 2019-02-26 ENCOUNTER — Encounter: Payer: Self-pay | Admitting: Family Medicine

## 2019-02-26 DIAGNOSIS — S92355A Nondisplaced fracture of fifth metatarsal bone, left foot, initial encounter for closed fracture: Secondary | ICD-10-CM

## 2019-02-26 DIAGNOSIS — S92352D Displaced fracture of fifth metatarsal bone, left foot, subsequent encounter for fracture with routine healing: Secondary | ICD-10-CM

## 2019-02-26 NOTE — Progress Notes (Signed)
X-ray shows fracture has not healed much at all and to my view looks a little worse.  At this point I think it is reasonable to have a evaluation with orthopedic surgery to discuss surgical possibility.  Would like me to place referral?  Do have a preferred orthopedic surgeon?

## 2019-02-28 ENCOUNTER — Other Ambulatory Visit: Payer: Self-pay

## 2019-02-28 ENCOUNTER — Encounter: Payer: Self-pay | Admitting: Orthopaedic Surgery

## 2019-02-28 ENCOUNTER — Ambulatory Visit (INDEPENDENT_AMBULATORY_CARE_PROVIDER_SITE_OTHER): Payer: Managed Care, Other (non HMO) | Admitting: Orthopaedic Surgery

## 2019-02-28 DIAGNOSIS — S92352D Displaced fracture of fifth metatarsal bone, left foot, subsequent encounter for fracture with routine healing: Secondary | ICD-10-CM | POA: Diagnosis not present

## 2019-02-28 NOTE — Progress Notes (Signed)
Office Visit Note   Patient: Ryan Montes           Date of Birth: 02/05/1981           MRN: [id number] Visit Date: 02/28/2019              Requested by: Ryan Hams, MD [address] [place],  [place] [id number] PCP: Ryan Barrack, MD   Assessment & Plan: Visit Diagnoses:  1. Fracture of base of fifth metatarsal bone of left foot with routine healing, subsequent encounter     Plan: I gave him reassurance that they are treating this appropriately with weightbearing as tolerated in his walking boot in a postoperative shoe.  I am encouraged by the fact that he is hurting minimally now.  I do feel he is going on to heal this fracture given the fact that he is a healthy individual.  He will still avoid high impact aerobic activities and over the next 2 weeks can transition back to regular shoes.  I would like to see him back in 4 weeks with a repeat 3 views of his left foot.  All question concerns were answered and addressed.  Follow-Up Instructions: Return in about 4 weeks (around 03/28/2019).   Orders:  No orders of the defined types were placed in this encounter.  No orders of the defined types were placed in this encounter.     Procedures: No procedures performed   Clinical Data: No additional findings.   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  He sustained 1/5 metatarsal fracture of his left foot back on October 5 of this year.  He is in a cam walking boot but also has a postoperative shoe.  He has been weightbearing as tolerated.  X-rays recently obtained by Dr. Georgina Montes was concerning that the fracture may not be healing sufficiently as of yet.  However the patient does today report much less pain that he had 3 weeks ago.  He has been weightbearing as tolerated.  He has low impact type of work.  He has been staying out of high impact aerobic  activity since he sustained this fracture.  Most importantly he is not a smoker and not a diabetic. HPI  Review of Systems He currently denies any headache, chest pain, shortness of breath, fever, chills, nausea, vomiting  Objective: Vital Signs: There were no vitals taken for this visit.  Physical Exam He is alert and orient x3 and in no acute distress Ortho Exam Examination of his left foot shows minimal pain over the base and shaft area of the fifth metatarsal on the left side.  There is no deformities otherwise of his foot.  There is no significant swelling or redness.  He has palpable pulses of the dorsalis pedis and posterior tibial pulses and his sensation is intact. Specialty Comments:  No specialty comments available.  Imaging: No results found. I reviewed x-rays most recently on December 1 of his left foot which was 3 views.  There is a fracture of the fifth metatarsal which is a Jones type fracture.  There is good alignment of this fracture.  I do see some evidence of healing.  PMFS History: Patient Active Problem List   Diagnosis Date Noted   Bilateral hand pain 11/28/2018   History reviewed. No pertinent past medical history.  Family History  Problem Relation Age of Onset   Cancer Mother        rectal   Diabetes Mother    Diabetes Maternal Grandfather     Past Surgical History:  Procedure Laterality Date   FRACTURE SURGERY     LAPAROSCOPIC APPENDECTOMY N/A 04/19/2018   Procedure: APPENDECTOMY LAPAROSCOPIC;  Surgeon: Darnell Level, MD;  Location: [facility];  Service: General;  Laterality: N/A;   Social History   Occupational History   Not on file  Tobacco Use   Smoking status: Never Smoker   Smokeless tobacco: Never Used  Substance and Sexual Activity   Alcohol use: Yes    Alcohol/week: 10.0 standard drinks    Types: 10 Standard drinks or equivalent per week   Drug use: Never   Sexual activity: Not on file

## 2019-03-11 ENCOUNTER — Ambulatory Visit: Payer: Managed Care, Other (non HMO) | Admitting: Family Medicine

## 2019-03-25 ENCOUNTER — Ambulatory Visit (INDEPENDENT_AMBULATORY_CARE_PROVIDER_SITE_OTHER): Payer: Managed Care, Other (non HMO) | Admitting: Orthopaedic Surgery

## 2019-03-25 ENCOUNTER — Encounter: Payer: Self-pay | Admitting: Orthopaedic Surgery

## 2019-03-25 ENCOUNTER — Ambulatory Visit (INDEPENDENT_AMBULATORY_CARE_PROVIDER_SITE_OTHER): Payer: Managed Care, Other (non HMO)

## 2019-03-25 ENCOUNTER — Other Ambulatory Visit: Payer: Self-pay

## 2019-03-25 DIAGNOSIS — S92352D Displaced fracture of fifth metatarsal bone, left foot, subsequent encounter for fracture with routine healing: Secondary | ICD-10-CM

## 2019-03-25 NOTE — Progress Notes (Signed)
The patient is under 2 months status post sustaining a left foot fifth metatarsal fracture.  This was a Jones fracture.  He is wearing regular shoes now he does have some tenderness at times especially with walking on uneven surfaces.  He does feel ready to play golf but not flag football.  On exam he is still slightly tender around the fifth metatarsal on his left foot but otherwise his foot exam is normal.  3 views left foot are obtained and the fracture lines are still visible but there is significant interval healing seen from previous films when compared to those other films.  I am fine with him playing golf but he will still avoid high impact aerobic activities.  If his foot is hurting he will back off.  I would like to see him back in 4 weeks.  At that visit I would like 2 views of the left foot with those being an AP and an oblique view.

## 2019-04-22 ENCOUNTER — Ambulatory Visit: Payer: Managed Care, Other (non HMO) | Admitting: Orthopaedic Surgery

## 2019-04-29 ENCOUNTER — Other Ambulatory Visit: Payer: Self-pay

## 2019-04-29 ENCOUNTER — Ambulatory Visit (INDEPENDENT_AMBULATORY_CARE_PROVIDER_SITE_OTHER): Payer: Managed Care, Other (non HMO) | Admitting: Orthopaedic Surgery

## 2019-04-29 ENCOUNTER — Ambulatory Visit (INDEPENDENT_AMBULATORY_CARE_PROVIDER_SITE_OTHER): Payer: Managed Care, Other (non HMO)

## 2019-04-29 ENCOUNTER — Encounter: Payer: Self-pay | Admitting: Orthopaedic Surgery

## 2019-04-29 DIAGNOSIS — S92352D Displaced fracture of fifth metatarsal bone, left foot, subsequent encounter for fracture with routine healing: Secondary | ICD-10-CM

## 2019-04-29 NOTE — Progress Notes (Signed)
Office Visit Note   Patient: Ryan Montes           Date of Birth: December 15, 1980           MRN: [id number] Visit Date: 04/29/2019              Requested by: Ryan Barrack, MD [address] [place],  [place] [address] PCP: Ryan Barrack, MD   Assessment & Plan: Visit Diagnoses:  1. Fracture of base of fifth metatarsal bone of left foot with routine healing, subsequent encounter     Plan: Recommend he continue low impact activities in the next 2 months.  We will see him back in 8 weeks at that time AP and oblique views of the left foot.  Questions encouraged  Follow-Up Instructions: Return in about 8 weeks (around 06/24/2019) for Radiographs.   Orders:  Orders Placed This Encounter  Procedures  . XR Foot 2 Views Left   No orders of the defined types were placed in this encounter.     Procedures: No procedures performed   Clinical Data: No additional findings.   Subjective: Chief Complaint  Patient presents with  . Left Foot - Follow-up    HPI  [name] returns today 3 months status post conservative treatment of a left fifth metatarsal Jones fracture.  States he still has some discomfort personally in the morning and wanted refill on foot significantly elevated today.  Otherwise he is doing well.  He is returned to playing golf without any adverse effects.  He is returned to a regular shoe.  He is wearing a carbon fiber orthotic and is golf shoes though and feels this helps with the pain in the left foot. Review of Systems Negative for fevers or chills.   Objective: Vital Signs: There were no vitals taken for this visit.  Physical Exam Constitutional:      Appearance: He is not ill-appearing or diaphoretic.  Pulmonary:     Effort: Pulmonary effort is normal.  Neurological:     Mental Status: He is alert.  Psychiatric:        Mood and Affect: Mood normal.     Ortho Exam Left foot no rashes skin lesions ulcerations or impending ulcers.  Has slight tenderness of  the fifth metatarsal shaft region.  5 out of 5 strength with eversion against resistance without pain.  specialty Comments:  No specialty comments available.  Imaging: XR Foot 2 Views Left  Result Date: 04/29/2019 Left foot AP oblique views: Further consolidation however fracture line is still slightly visible.  No change in overall position alignment.  No other fractures identified.    PMFS History: Patient Active Problem List   Diagnosis Date Noted  . Bilateral hand pain 11/28/2018   History reviewed. No pertinent past medical history.  Family History  Problem Relation Age of Onset  . Cancer Mother        rectal  . Diabetes Mother   . Diabetes Maternal Grandfather     Past Surgical History:  Procedure Laterality Date  . FRACTURE SURGERY    . LAPAROSCOPIC APPENDECTOMY N/A 04/19/2018   Procedure: APPENDECTOMY LAPAROSCOPIC;  Surgeon: Armandina Gemma, MD;  Location: [facility];  Service: General;  Laterality: N/A;   Social History   Occupational History  . Not on file  Tobacco Use  . Smoking status: Never Smoker  . Smokeless tobacco: Never Used  Substance and Sexual Activity  . Alcohol use: Yes    Alcohol/week: 10.0 standard drinks  Types: 10 Standard drinks or equivalent per week  . Drug use: Never  . Sexual activity: Not on file

## 2019-07-01 ENCOUNTER — Encounter: Payer: Self-pay | Admitting: Orthopaedic Surgery

## 2019-07-01 ENCOUNTER — Other Ambulatory Visit: Payer: Self-pay

## 2019-07-01 ENCOUNTER — Ambulatory Visit: Payer: Self-pay

## 2019-07-01 ENCOUNTER — Ambulatory Visit (INDEPENDENT_AMBULATORY_CARE_PROVIDER_SITE_OTHER): Payer: Managed Care, Other (non HMO) | Admitting: Orthopaedic Surgery

## 2019-07-01 DIAGNOSIS — S92352D Displaced fracture of fifth metatarsal bone, left foot, subsequent encounter for fracture with routine healing: Secondary | ICD-10-CM | POA: Diagnosis not present

## 2019-07-01 NOTE — Progress Notes (Signed)
The patient is now about 6 months out from a Jones fracture involving his left foot.  He has minimal pain with any activities at all.  He has not tried to run.  He has been playing golf.  He says he is really having no significant pain.  On examination of his left foot there is no pain to palpation over the fifth metatarsal.  2 views left foot are obtained and reviewed with previous films.  The fifth metatarsal fracture is healed.  We went over his x-rays in detail.  He can get back to contact sports as comfort allows.  Obviously if he starts to have any issues at all he knows to come back and see [place].  All questions concerns were answered and addressed.

## 2019-11-21 ENCOUNTER — Encounter: Payer: Self-pay | Admitting: Family Medicine

## 2020-12-13 IMAGING — DX DG FOOT COMPLETE 3+V*L*
3 series · 3 of 3 positions shown · non-contrast
Comparison: No recent.

CLINICAL DATA: Left foot pain.

EXAM:
LEFT FOOT - COMPLETE 3+ VIEW

[foot dp]
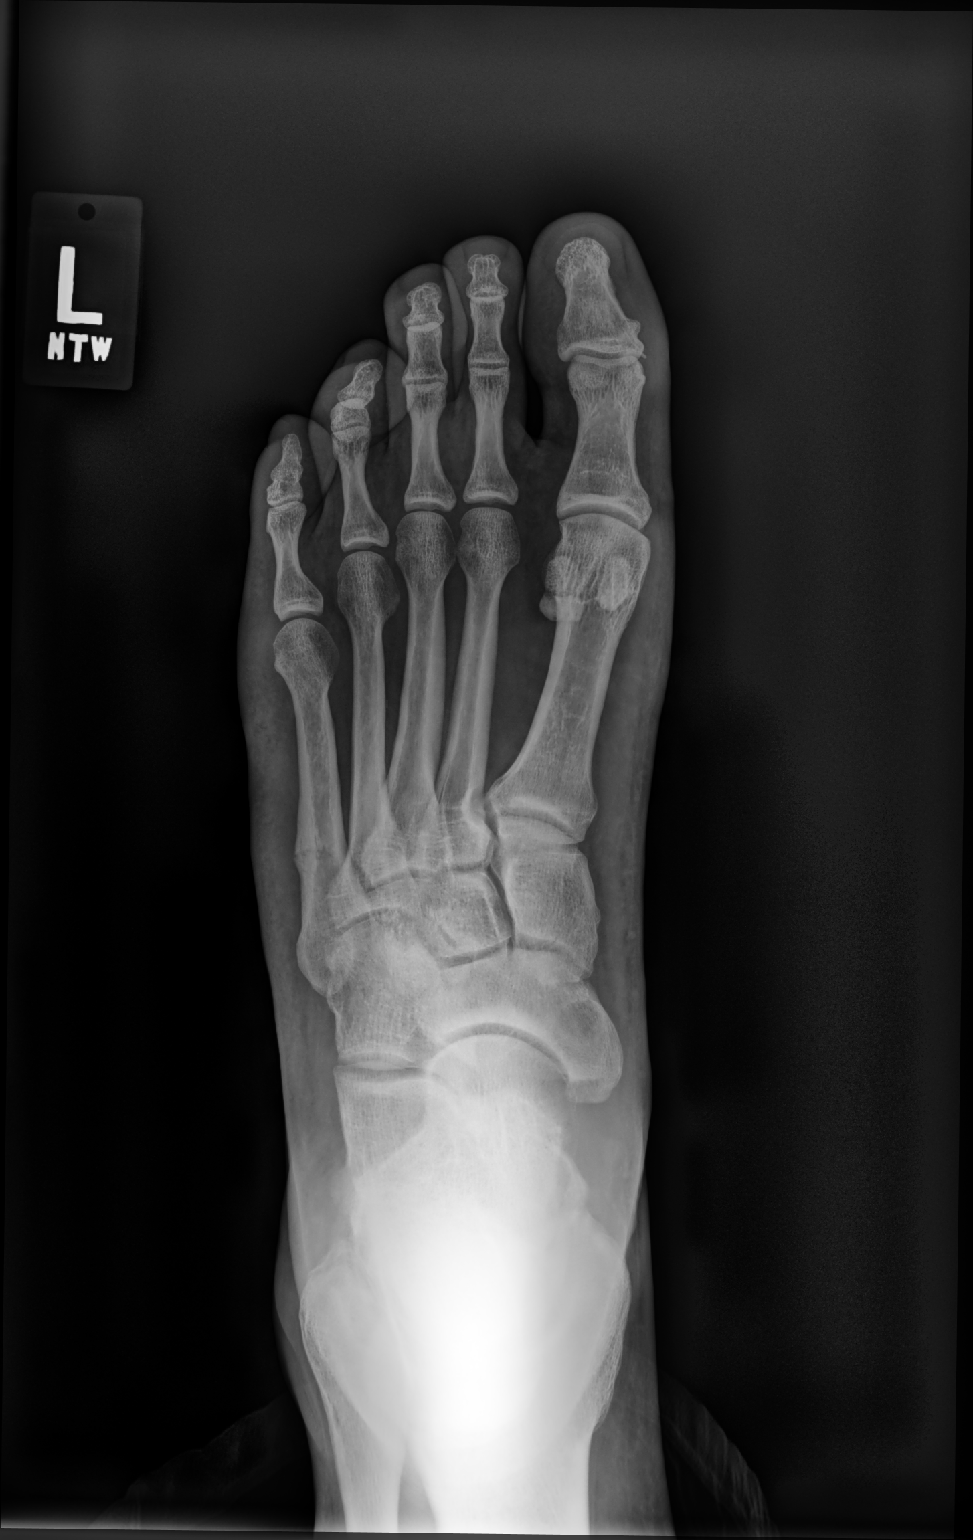

[foot oblique]
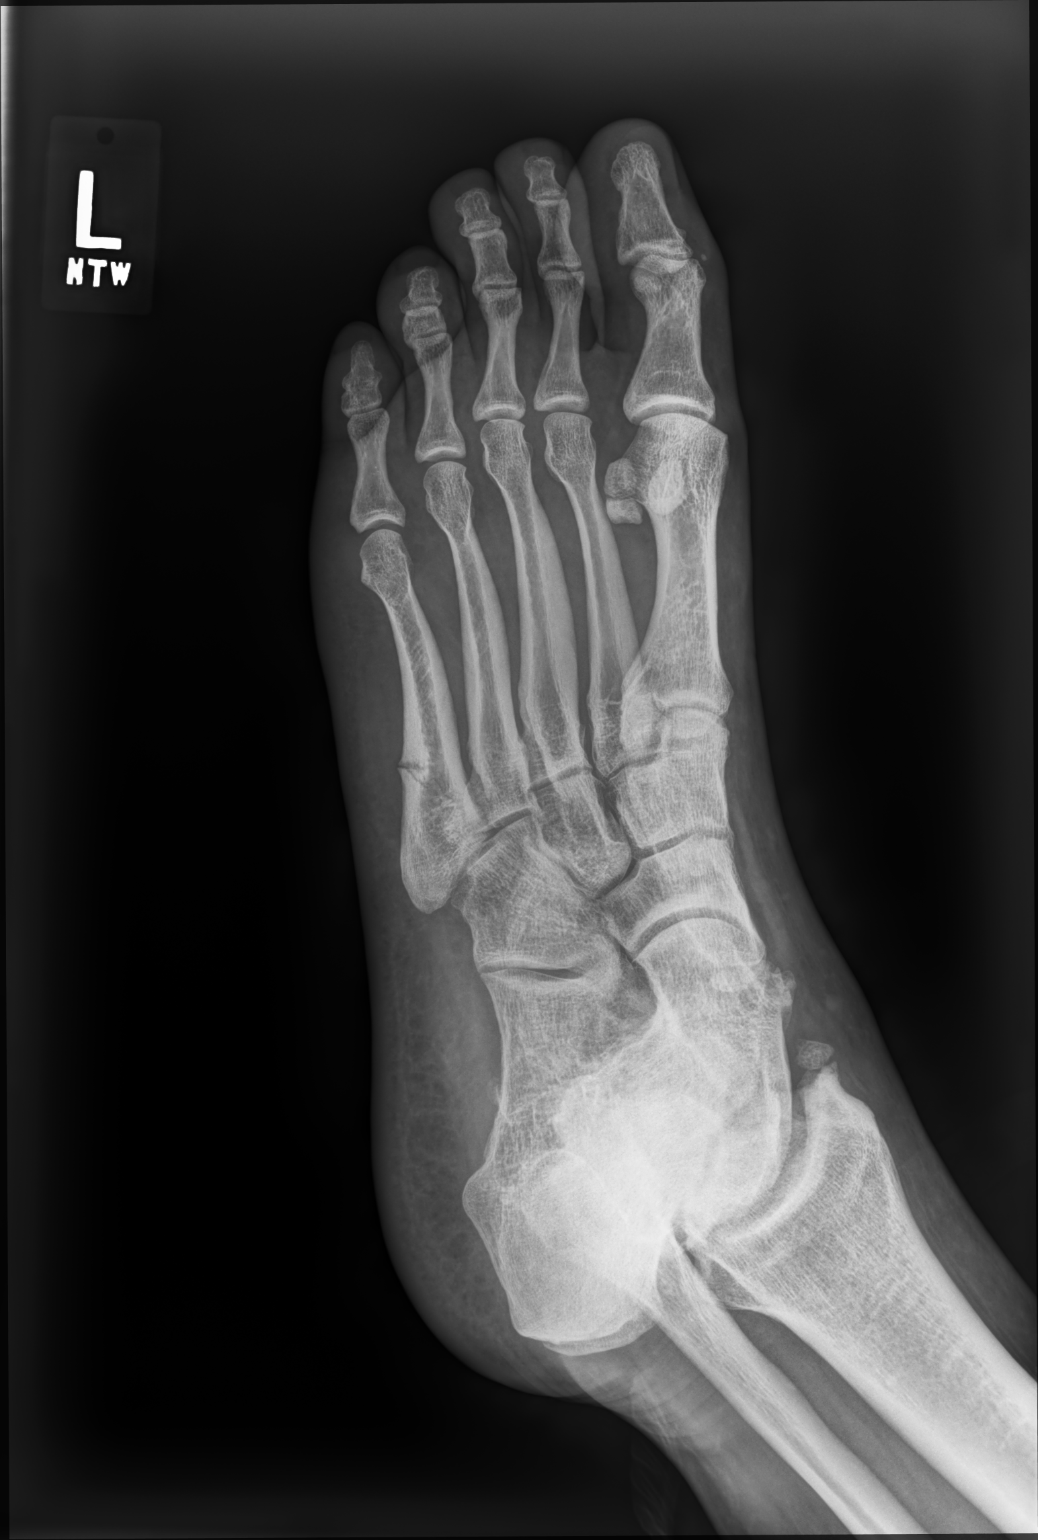

[foot lat]
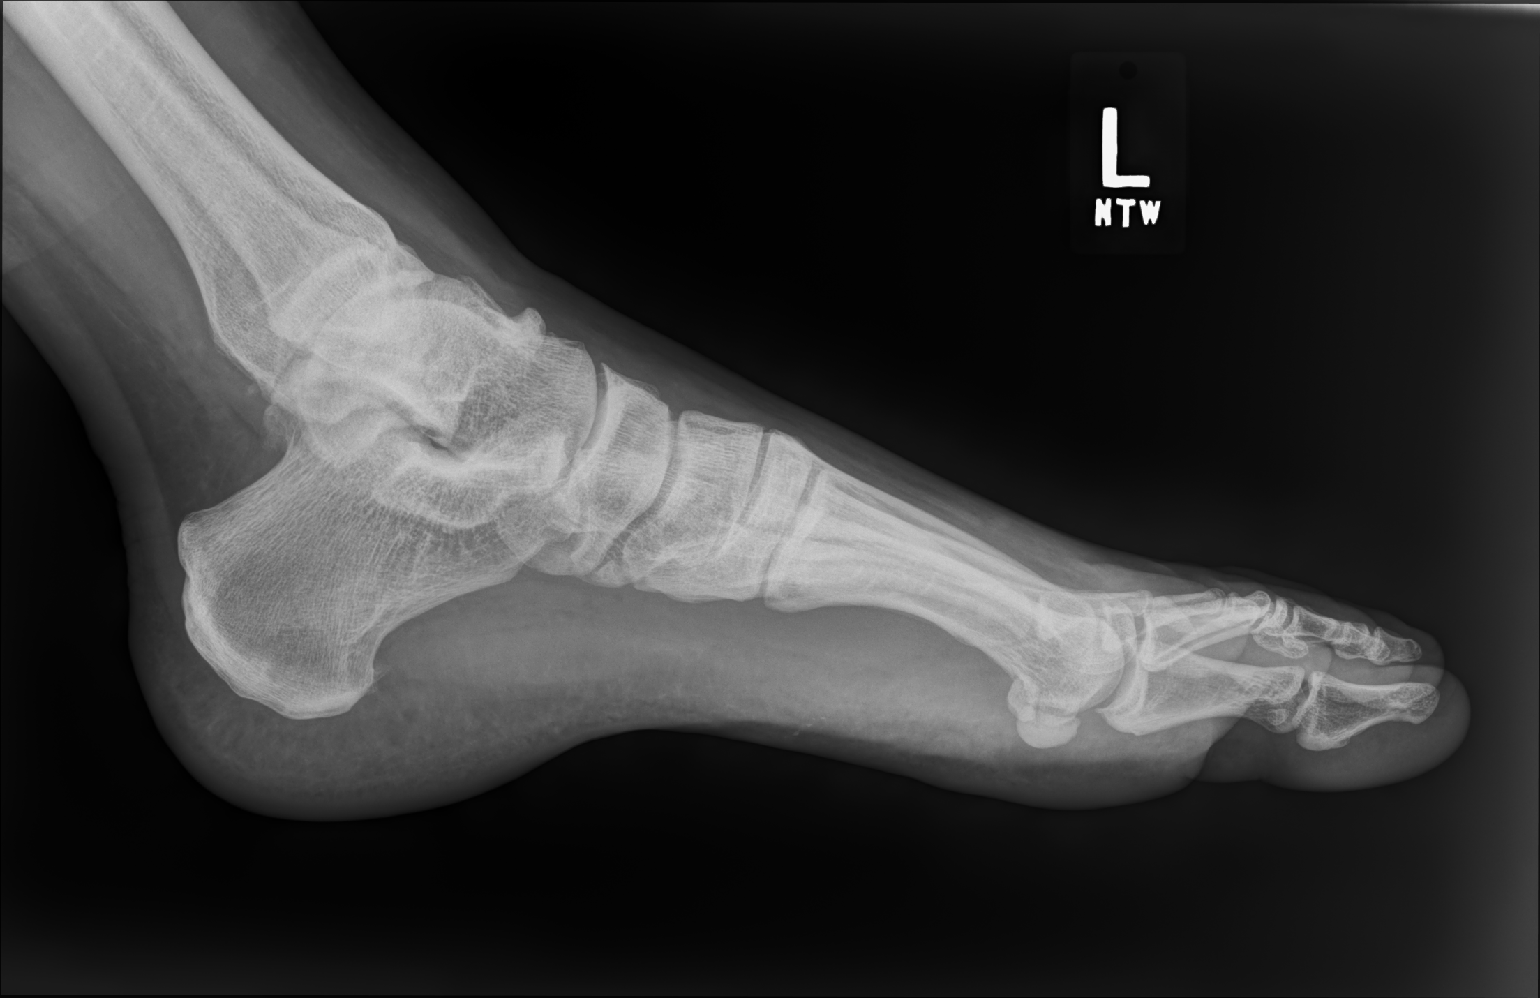

[3 of 3 positions shown; findings below may reference images not displayed]

FINDINGS: Diffuse degenerative change left foot. Nondisplaced fracture of the
base of the left fifth metatarsal noted. Prominent
well-circumscribed lucency noted in the navicula noted only on one
view. Further evaluation with MRI of the left foot should be
considered. No other focal abnormality identified. Corticated bony
density noted adjacent to the medial malleolus consistent with old
fracture fragment.
IMPRESSION: 1.  Nondisplaced fracture of the base of the left fifth metatarsal.

2. Well-circumscribed prominent lucency noted in the navicula, noted
only on one view. Further evaluation with of the left foot MRI
should be considered.

## 2021-01-04 IMAGING — DX DG FOOT COMPLETE 3+V*L*
3 series · 3 of 3 positions shown · non-contrast
Comparison: February 04, 2019

CLINICAL DATA: Fifth metatarsal fracture, follow-up

EXAM:
LEFT FOOT - COMPLETE 3+ VIEW

[foot dp]
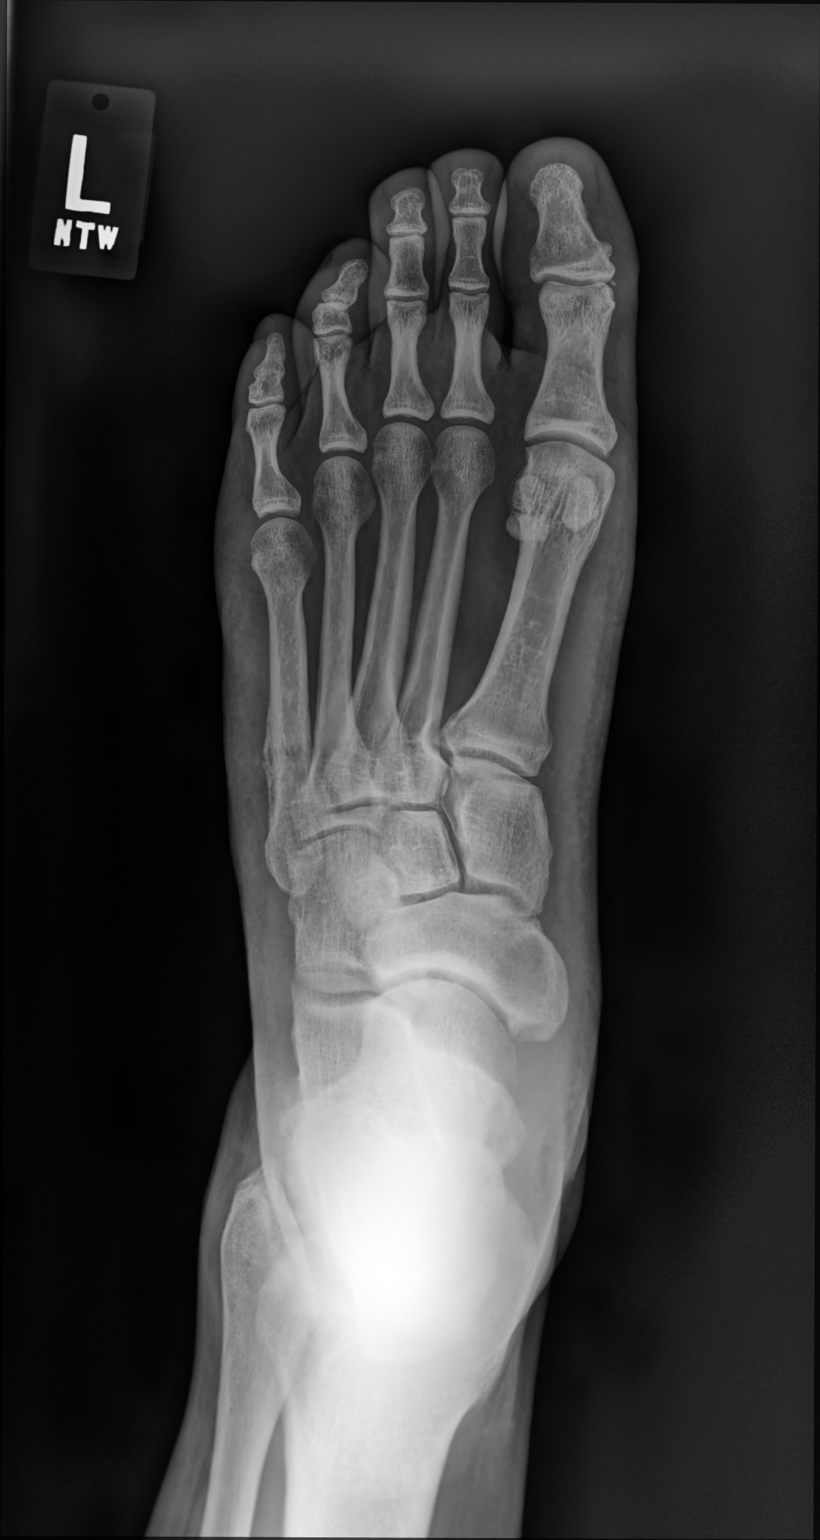

[foot oblique]
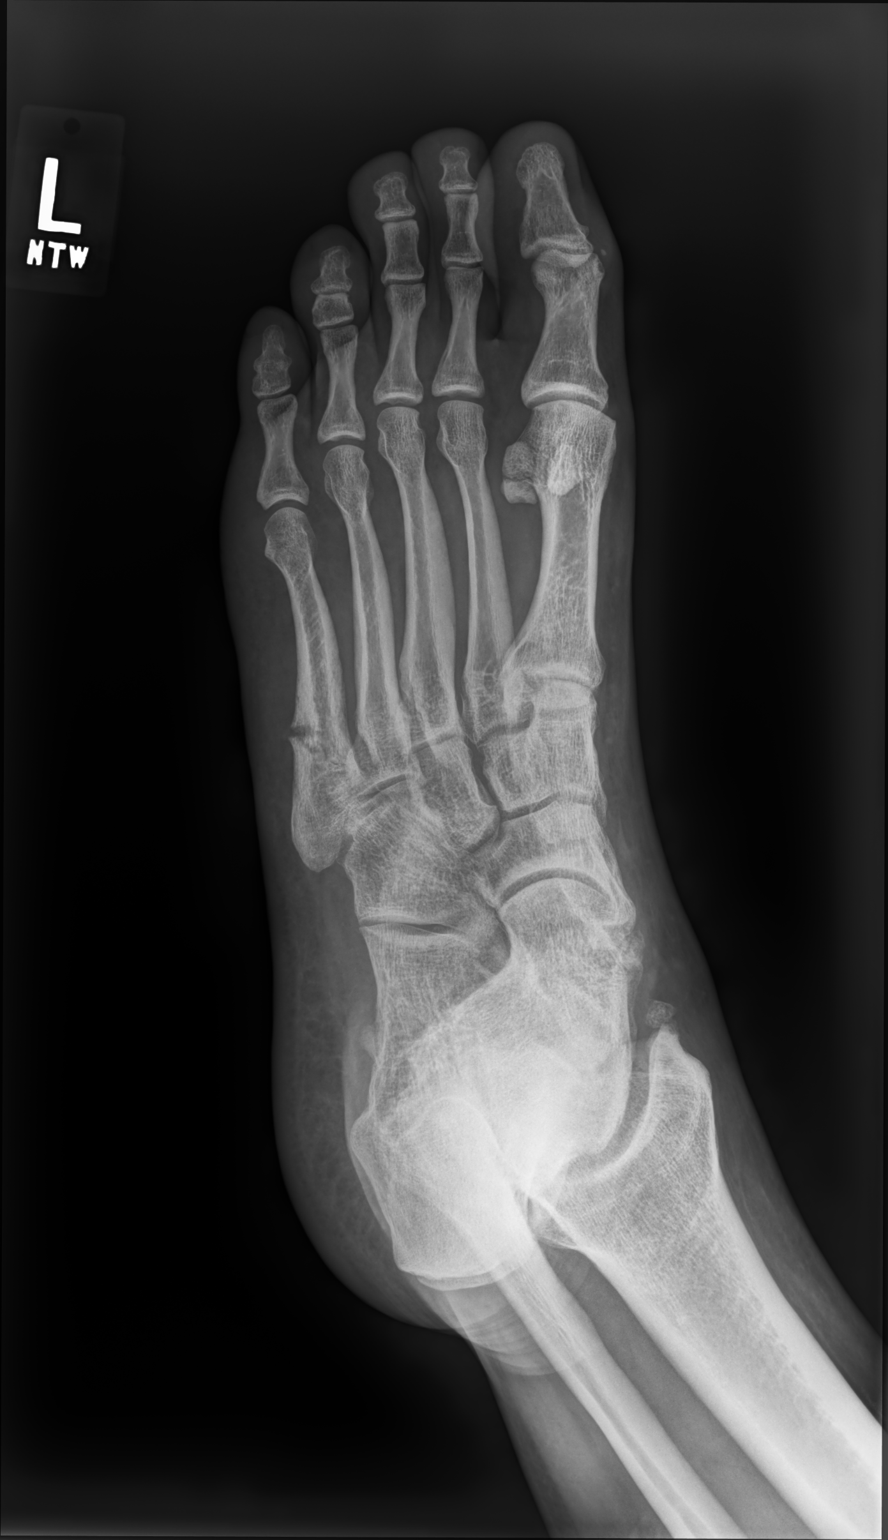

[foot lat]
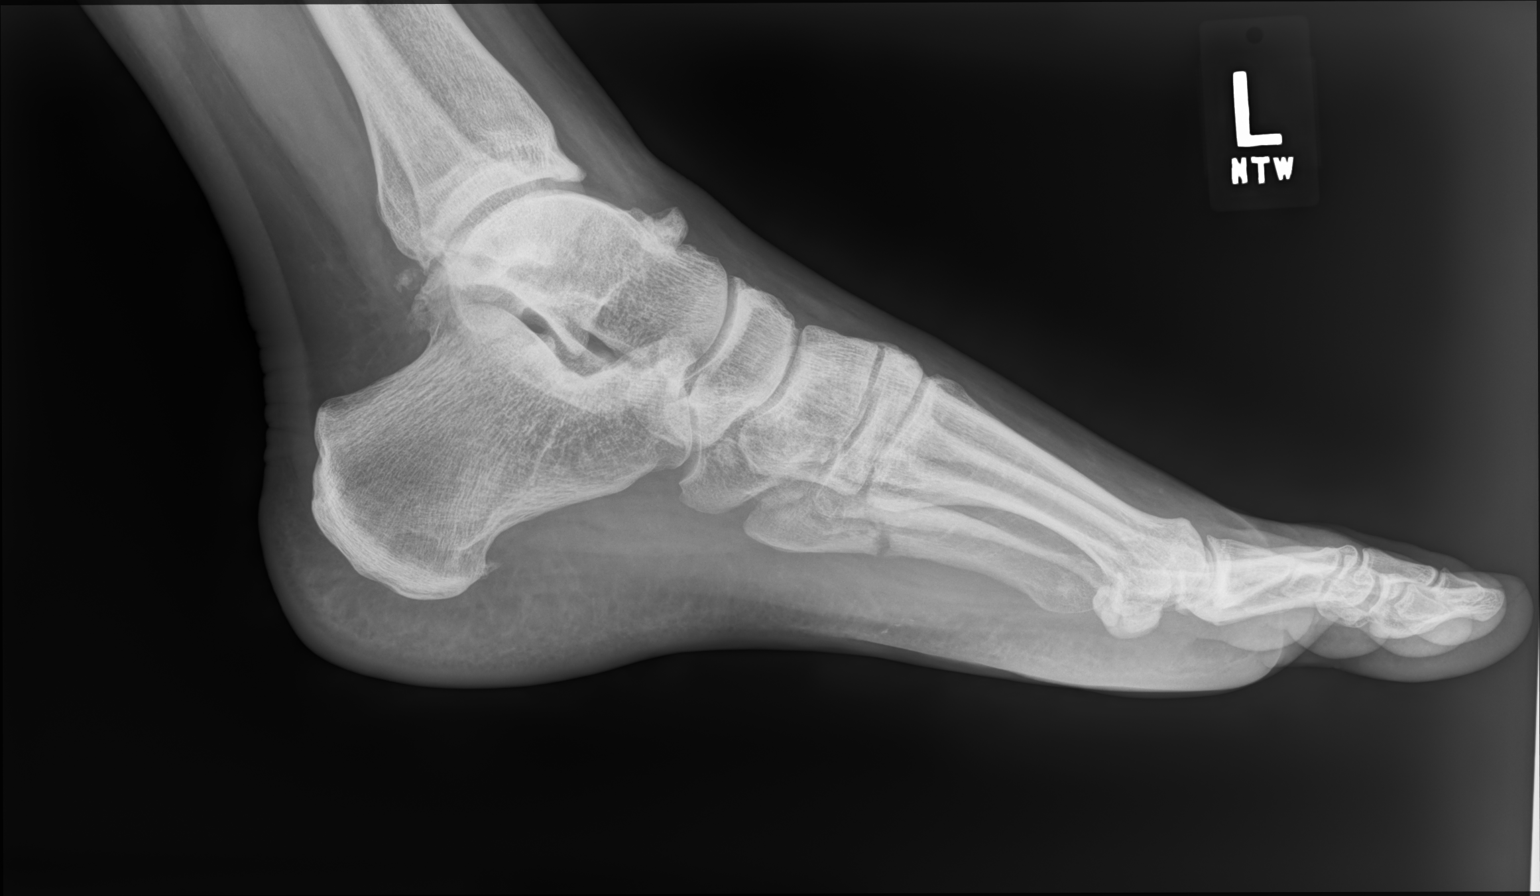

[3 of 3 positions shown; findings below may reference images not displayed]

FINDINGS: A nondisplaced fracture through the proximal shaft of the left fifth
metatarsal is again noted. Fracture line remains and there is
slightly increased lucency. Very minimal healing response is noted.
Area of circumscribed lucency within the medial aspect of the
navicular is again noted. Degenerative spurring of the dorsal
hindfoot.
IMPRESSION: Persistent nondisplaced fracture of the left fifth metatarsal with
very minimal healing response.

## 2021-07-19 ENCOUNTER — Ambulatory Visit (INDEPENDENT_AMBULATORY_CARE_PROVIDER_SITE_OTHER): Payer: No Typology Code available for payment source | Admitting: Orthopaedic Surgery

## 2021-07-19 ENCOUNTER — Encounter: Payer: Self-pay | Admitting: Orthopaedic Surgery

## 2021-07-19 DIAGNOSIS — M7711 Lateral epicondylitis, right elbow: Secondary | ICD-10-CM

## 2021-07-19 MED ORDER — LIDOCAINE HCL 1 % IJ SOLN
1.0000 mL | INTRAMUSCULAR | Status: AC | PRN
  Administered 2021-07-19: 1 mL

## 2021-07-19 MED ORDER — METHYLPREDNISOLONE ACETATE 40 MG/ML IJ SUSP
40.0000 mg | INTRAMUSCULAR | Status: AC | PRN
  Administered 2021-07-19: 40 mg

## 2021-07-19 NOTE — Progress Notes (Signed)
? ?Office Visit Note ?  ?Patient: Ryan Montes           ?Date of Birth: [birth year]           ?MRN: [id number] ?Visit Date: 07/19/2021 ?             ?Requested by: Ardith Dark, MD ?[phone] ?[place],  [place] [address] ?PCP: Ardith Dark, MD ? ? ?Assessment & Plan: ?Visit Diagnoses:  ?1. Lateral epicondylitis, right elbow   ? ? ?Plan: His clinical exam and signs and symptoms are consistent with lateral epicondylitis of the right elbow.  I have shown him stretching exercises to try and I want him to get Voltaren gel to try in this area 2-3 times a day.  I did offer a steroid injection over the lateral condyle area and he agreed to it and tolerated it well.  I would certainly consider repeat injection in 6 to 8 weeks if needed followed by physical therapy if this is not getting better.  He should also work on activity modification with keeping away from pronated grip. ? ?Follow-Up Instructions: Return if symptoms worsen or fail to improve.  ? ?Orders:  ?Orders Placed This Encounter  ?Procedures  ? Hand/UE Inj  ? ?No orders of the defined types were placed in this encounter. ? ? ? ? Procedures: ?Hand/UE Inj: R elbow for lateral epicondylitis on 07/19/2021 2:30 PM ?Medications: 1 mL lidocaine 1 %; 40 mg methylPREDNISolone acetate 40 MG/ML ? ? ? ? ?Clinical Data: ?No additional findings. ? ? ?Subjective: ?Chief Complaint  ?Patient presents with  ? Right Elbow - Pain  ?Ryan Montes comes in today with a 5 to 6-week history of right elbow pain along the lateral epicondyle area of his elbow.  He had been throwing a lot of football and playing baseball and tennis on a regular basis especially with his kids.  He feels like now is losing his grip on that side.  He reports decreased strength but is mainly pain over the lateral epicondyle area where he hurts the most.  He has tried over-the-counter anti-inflammatories.  He has not really modified his activities.  He denies any numbness and tingling in his hand. ? ?HPI ? ?Review of  Systems ?There is no listed fever, chills, nausea, vomiting ? ?Objective: ?Vital Signs: There were no vitals taken for this visit. ? ?Physical Exam ?He is alert and orient x3 and in no acute distress ?Ortho Exam ?He has full range of motion of his right elbow.  His pain is to palpation over the lateral epicondyle area.  The elbow is ligamentously stable.  His motor and sensory function is normal in the right upper extremity ?Specialty Comments:  ?No specialty comments available. ? ?Imaging: ?No results found. ? ? ?PMFS History: ?Patient Active Problem List  ? Diagnosis Date Noted  ? Bilateral hand pain 11/28/2018  ? ?History reviewed. No pertinent past medical history.  ?Family History  ?Problem Relation Age of Onset  ? Cancer Mother   ?     rectal  ? Diabetes Mother   ? Diabetes Maternal Grandfather   ?  ?Past Surgical History:  ?Procedure Laterality Date  ? FRACTURE SURGERY    ? LAPAROSCOPIC APPENDECTOMY N/A 04/19/2018  ? Procedure: APPENDECTOMY LAPAROSCOPIC;  Surgeon: Darnell Level, MD;  Location: [facility];  Service: General;  Laterality: N/A;  ? ?Social History  ? ?Occupational History  ? Not on file  ?Tobacco Use  ? Smoking status: Never  ? Smokeless tobacco:  Never  ?Vaping Use  ? Vaping Use: Never used  ?Substance and Sexual Activity  ? Alcohol use: Yes  ?  Alcohol/week: 10.0 standard drinks  ?  Types: 10 Standard drinks or equivalent per week  ? Drug use: Never  ? Sexual activity: Not on file  ? ? ? ? ? ? ?

## 2021-11-16 ENCOUNTER — Telehealth: Payer: Self-pay | Admitting: Orthopaedic Surgery

## 2021-11-16 NOTE — Telephone Encounter (Signed)
Called pt 1X and left vm for pt to call and set an appt with Dr Magnus Ivan for rt elbow injection.

## 2021-12-06 ENCOUNTER — Ambulatory Visit (INDEPENDENT_AMBULATORY_CARE_PROVIDER_SITE_OTHER): Payer: No Typology Code available for payment source | Admitting: Orthopaedic Surgery

## 2021-12-06 ENCOUNTER — Encounter: Payer: Self-pay | Admitting: Orthopaedic Surgery

## 2021-12-06 DIAGNOSIS — M7711 Lateral epicondylitis, right elbow: Secondary | ICD-10-CM

## 2021-12-06 MED ORDER — LIDOCAINE HCL 1 % IJ SOLN
1.0000 mL | INTRAMUSCULAR | Status: AC | PRN
  Administered 2021-12-06: 1 mL

## 2021-12-06 MED ORDER — METHYLPREDNISOLONE ACETATE 40 MG/ML IJ SUSP
40.0000 mg | INTRAMUSCULAR | Status: AC | PRN
  Administered 2021-12-06: 40 mg

## 2021-12-06 NOTE — Progress Notes (Signed)
Office Visit Note   Patient: Ryan Montes           Date of Birth: Jan 31, 1981           MRN: [id number] Visit Date: 12/06/2021              Requested by: Ardith Dark, MD [address] [place],  [place] [address] PCP: Ardith Dark, MD   Assessment & Plan: Visit Diagnoses:  1. Lateral epicondylitis, right elbow     Plan: I did provide a second steroid injection over the left elbow lateral epicondylar area which she tolerated well.  If he does experience a recurrence in his pain, my next step would be to obtain an MRI of the right elbow to rule out a tear.  He will let us know.  Follow-Up Instructions: Return if symptoms worsen or fail to improve.   Orders:  Orders Placed This Encounter  Procedures   Hand/UE Inj   No orders of the defined types were placed in this encounter.     Procedures: Hand/UE Inj: R elbow for lateral epicondylitis on 12/06/2021 1:54 PM Medications: 1 mL lidocaine 1 %; 40 mg methylPREDNISolone acetate 40 MG/ML      Clinical Data: No additional findings.   Subjective: Chief Complaint  Patient presents with   Right Elbow - Pain  The patient is well-known to me.  He has been dealing with right elbow pain over the lateral epicondyle for some time now.  Back in April of this year we did provide a steroid injection over the lateral epicondyle and he did great for a few months.  He does a lot of throwing activities and he said this really started hurting him again about a month ago over the lateral aspect of his elbow.  He denies any injuries.  He does not do a lot of repetitive types of activities other than occasionally golf and throwing a football or throwing a ball.  He denies any numbness and tingling.  He is requesting another steroid injection today.  HPI  Review of Systems There is currently listed no fever, chills, nausea, vomiting.  He is not a diabetic  Objective: Vital Signs: There were no vitals taken for this visit.  Physical  Exam He is alert and orient x3 and in no acute distress Ortho Exam Examination of his right elbow shows only pain over the lateral epicondyle.  The remainder of the elbow exam is entirely normal in terms of range of motion and stability. Specialty Comments:  No specialty comments available.  Imaging: No results found.   PMFS History: Patient Active Problem List   Diagnosis Date Noted   Bilateral hand pain 11/28/2018   History reviewed. No pertinent past medical history.  Family History  Problem Relation Age of Onset   Cancer Mother        rectal   Diabetes Mother    Diabetes Maternal Grandfather     Past Surgical History:  Procedure Laterality Date   FRACTURE SURGERY     LAPAROSCOPIC APPENDECTOMY N/A 04/19/2018   Procedure: APPENDECTOMY LAPAROSCOPIC;  Surgeon: Darnell Level, MD;  Location: [facility];  Service: General;  Laterality: N/A;   Social History   Occupational History   Not on file  Tobacco Use   Smoking status: Never   Smokeless tobacco: Never  Vaping Use   Vaping Use: Never used  Substance and Sexual Activity   Alcohol use: Yes    Alcohol/week: 10.0 standard drinks of alcohol  Types: 10 Standard drinks or equivalent per week   Drug use: Never   Sexual activity: Not on file

## 2021-12-20 ENCOUNTER — Encounter: Payer: Self-pay | Admitting: *Deleted

## 2021-12-21 ENCOUNTER — Ambulatory Visit (INDEPENDENT_AMBULATORY_CARE_PROVIDER_SITE_OTHER): Payer: No Typology Code available for payment source | Admitting: Family Medicine

## 2021-12-21 ENCOUNTER — Encounter: Payer: Self-pay | Admitting: Family Medicine

## 2021-12-21 VITALS — BP 130/83 | HR 90 | Temp 98.0°F | Ht 75.25 in | Wt 240.6 lb

## 2021-12-21 DIAGNOSIS — Z131 Encounter for screening for diabetes mellitus: Secondary | ICD-10-CM | POA: Diagnosis not present

## 2021-12-21 DIAGNOSIS — R739 Hyperglycemia, unspecified: Secondary | ICD-10-CM

## 2021-12-21 DIAGNOSIS — Z13220 Encounter for screening for lipoid disorders: Secondary | ICD-10-CM

## 2021-12-21 DIAGNOSIS — Z1159 Encounter for screening for other viral diseases: Secondary | ICD-10-CM | POA: Diagnosis not present

## 2021-12-21 DIAGNOSIS — Z0001 Encounter for general adult medical examination with abnormal findings: Secondary | ICD-10-CM

## 2021-12-21 DIAGNOSIS — Z114 Encounter for screening for human immunodeficiency virus [HIV]: Secondary | ICD-10-CM

## 2021-12-21 LAB — LIPID PANEL
Cholesterol: 147 mg/dL (ref 0–200)
HDL: 48 mg/dL (ref >39.00)
LDL Cholesterol: 81 mg/dL (ref 0–99)
NonHDL: 98.76
Total CHOL/HDL Ratio: 3
Triglycerides: 91 mg/dL (ref 0.0–149.0)
VLDL: 18.2 mg/dL (ref 0.0–40.0)

## 2021-12-21 LAB — COMPREHENSIVE METABOLIC PANEL
ALT: 19 U/L (ref 0–53)
AST: 19 U/L (ref 0–37)
Albumin: 4.5 g/dL (ref 3.5–5.2)
Alkaline Phosphatase: 82 U/L (ref 39–117)
BUN: 12 mg/dL (ref 6–23)
CO2: 27 mEq/L (ref 19–32)
Calcium: 9.5 mg/dL (ref 8.4–10.5)
Chloride: 103 mEq/L (ref 96–112)
Creatinine, Ser: 0.88 mg/dL (ref 0.40–1.50)
GFR: 108.41 mL/min (ref >60.00)
Glucose, Bld: 105 mg/dL — ABNORMAL HIGH (ref 70–99)
Potassium: 3.9 mEq/L (ref 3.5–5.1)
Sodium: 136 mEq/L (ref 135–145)
Total Bilirubin: 0.5 mg/dL (ref 0.2–1.2)
Total Protein: 7.7 g/dL (ref 6.0–8.3)

## 2021-12-21 LAB — CBC
HCT: 41.7 % (ref 39.0–52.0)
Hemoglobin: 13.9 g/dL (ref 13.0–17.0)
MCHC: 33.3 g/dL (ref 30.0–36.0)
MCV: 88.8 fl (ref 78.0–100.0)
Platelets: 238 10*3/uL (ref 150.0–400.0)
RBC: 4.7 Mil/uL (ref 4.22–5.81)
RDW: 13.9 % (ref 11.5–15.5)
WBC: 6.9 10*3/uL (ref 4.0–10.5)

## 2021-12-21 LAB — HEMOGLOBIN A1C: Hgb A1c MFr Bld: 5.8 % (ref 4.6–6.5)

## 2021-12-21 LAB — TSH: TSH: 0.83 u[IU]/mL (ref 0.35–5.50)

## 2021-12-21 NOTE — Patient Instructions (Signed)
It was very nice to see you today!  Please work on the exercises.  Let us know or let Dr. Magnus Ivan know if not improving.  We will check blood work today.  We will see back in year for your next physical.  Come back sooner if needed.  Take care, Dr Jimmey Ralph  PLEASE NOTE:  If you had any lab tests please let us know if you have not heard back within a few days. You may see your results on mychart before we have a chance to review them but we will give you a call once they are reviewed by [place]. If we ordered any referrals today, please let us know if you have not heard from their office within the next week.   Please try these tips to maintain a healthy lifestyle:  Eat at least 3 REAL meals and 1-2 snacks per day.  Aim for no more than 5 hours between eating.  If you eat breakfast, please do so within one hour of getting up.   Each meal should contain half fruits/vegetables, one quarter protein, and one quarter carbs (no bigger than a computer mouse)  Cut down on sweet beverages. This includes juice, soda, and sweet tea.   Drink at least 1 glass of water with each meal and aim for at least 8 glasses per day  Exercise at least 150 minutes every week.     Preventive Care 7-32 Years Old, Male Preventive care refers to lifestyle choices and visits with your health care provider that can promote health and wellness. Preventive care visits are also called wellness exams. What can I expect for my preventive care visit? Counseling During your preventive care visit, your health care provider may ask about your: Medical history, including: Past medical problems. Family medical history. Current health, including: Emotional well-being. Home life and relationship well-being. Sexual activity. Lifestyle, including: Alcohol, nicotine or tobacco, and drug use. Access to firearms. Diet, exercise, and sleep habits. Safety issues such as seatbelt and bike helmet use. Sunscreen use. Work and work  Astronomer. Physical exam Your health care provider may check your: Height and weight. These may be used to calculate your BMI (body mass index). BMI is a measurement that tells if you are at a healthy weight. Waist circumference. This measures the distance around your waistline. This measurement also tells if you are at a healthy weight and may help predict your risk of certain diseases, such as type 2 diabetes and high blood pressure. Heart rate and blood pressure. Body temperature. Skin for abnormal spots. What immunizations do I need?  Vaccines are usually given at various ages, according to a schedule. Your health care provider will recommend vaccines for you based on your age, medical history, and lifestyle or other factors, such as travel or where you work. What tests do I need? Screening Your health care provider may recommend screening tests for certain conditions. This may include: Lipid and cholesterol levels. Diabetes screening. This is done by checking your blood sugar (glucose) after you have not eaten for a while (fasting). Hepatitis B test. Hepatitis C test. HIV (human immunodeficiency virus) test. STI (sexually transmitted infection) testing, if you are at risk. Talk with your health care provider about your test results, treatment options, and if necessary, the need for more tests. Follow these instructions at home: Eating and drinking  Eat a healthy diet that includes fresh fruits and vegetables, whole grains, lean protein, and low-fat dairy products. Drink enough fluid to keep your  urine pale yellow. Take vitamin and mineral supplements as recommended by your health care provider. Do not drink alcohol if your health care provider tells you not to drink. If you drink alcohol: Limit how much you have to 0-2 drinks a day. Know how much alcohol is in your drink. In the [place] one drink equals one 12 oz bottle of beer (355 mL), one 5 oz glass of wine (148 mL), or one 1  oz glass of hard liquor (44 mL). Lifestyle Brush your teeth every morning and night with fluoride toothpaste. Floss one time each day. Exercise for at least 30 minutes 5 or more days each week. Do not use any products that contain nicotine or tobacco. These products include cigarettes, chewing tobacco, and vaping devices, such as e-cigarettes. If you need help quitting, ask your health care provider. Do not use drugs. If you are sexually active, practice safe sex. Use a condom or other form of protection to prevent STIs. Find healthy ways to manage stress, such as: Meditation, yoga, or listening to music. Journaling. Talking to a trusted person. Spending time with friends and family. Minimize exposure to UV radiation to reduce your risk of skin cancer. Safety Always wear your seat belt while driving or riding in a vehicle. Do not drive: If you have been drinking alcohol. Do not ride with someone who has been drinking. If you have been using any mind-altering substances or drugs. While texting. When you are tired or distracted. Wear a helmet and other protective equipment during sports activities. If you have firearms in your house, make sure you follow all gun safety procedures. Seek help if you have been physically or sexually abused. What's next? Go to your health care provider once a year for an annual wellness visit. Ask your health care provider how often you should have your eyes and teeth checked. Stay up to date on all vaccines. This information is not intended to replace advice given to you by your health care provider. Make sure you discuss any questions you have with your health care provider. Document Revised: 09/09/2020 Document Reviewed: 09/09/2020 Elsevier Patient Education  [employer].

## 2021-12-21 NOTE — Progress Notes (Signed)
Chief Complaint:  Ryan Montes is a 41 y.o. male who presents today for his annual comprehensive physical exam.    Assessment/Plan:  Left buttocks pain Possibly gluteus strain.  Has normal testing with resisted hamstring today-doubt hamstring tear.  We discussed home exercises and handout was given.  He will follow-up with orthopedics if not improving.  Preventative Healthcare: Check labs. Flu vaccine deferred.   Patient Counseling(The following topics were reviewed and/or handout was given):  -Nutrition: Stressed importance of moderation in sodium/caffeine intake, saturated fat and cholesterol, caloric balance, sufficient intake of fresh fruits, vegetables, and fiber.  -Stressed the importance of regular exercise.   -Substance Abuse: Discussed cessation/primary prevention of tobacco, alcohol, or other drug use; driving or other dangerous activities under the influence; availability of treatment for abuse.   -Injury prevention: Discussed safety belts, safety helmets, smoke detector, smoking near bedding or upholstery.   -Sexuality: Discussed sexually transmitted diseases, partner selection, use of condoms, avoidance of unintended pregnancy and contraceptive alternatives.   -Dental health: Discussed importance of regular tooth brushing, flossing, and dental visits.  -Health maintenance and immunizations reviewed. Please refer to Health maintenance section.  Return to care in 1 year for next preventative visit.     Subjective:  HPI:  He has no acute complaints today.   He has been having left buttocks pain for the last few months. Injuried it while playing with his children several onths ago. Pain has been improving. States that he was in a seated position when his daughter jump on his back and he felt a pop in his left buttocks.  He initially had some pain with movement but this seems to be improving.  Lifestyle Diet: None specific.  Exercise: Limited due to recent orthopedic  injuries.      12/21/2021    1:12 PM  Depression screen PHQ 2/9  Decreased Interest 0  Down, Depressed, Hopeless 0  PHQ - 2 Score 0    Health Maintenance Due  Topic Date Due   Hepatitis C Screening  Never done     ROS: Per HPI, otherwise a complete review of systems was negative.   PMH:  The following were reviewed and entered/updated in epic: History reviewed. No pertinent past medical history. There are no problems to display for this patient.  Past Surgical History:  Procedure Laterality Date   FRACTURE SURGERY     LAPAROSCOPIC APPENDECTOMY N/A 04/19/2018   Procedure: APPENDECTOMY LAPAROSCOPIC;  Surgeon: Armandina Gemma, MD;  Location: [facility];  Service: General;  Laterality: N/A;    Family History  Problem Relation Age of Onset   Cancer Mother        rectal   Diabetes Mother    Diabetes Maternal Grandfather     Medications- reviewed and updated Current Outpatient Medications  Medication Sig Dispense Refill   acetaminophen (TYLENOL) 500 MG tablet Take 500 mg by mouth every 6 (six) hours as needed.     ibuprofen (ADVIL) 200 MG tablet Take 200 mg by mouth 3 (three) times daily.     No current facility-administered medications for this visit.    Allergies-reviewed and updated Allergies  Allergen Reactions   Augmentin [Amoxicillin-Pot Clavulanate] Hives   Suprax [Cefixime] Hives    Social History   Socioeconomic History   Marital status: Married    Spouse name: Not on file   Number of children: Not on file   Years of education: Not on file   Highest education level: Not on file  Occupational History  Not on file  Tobacco Use   Smoking status: Never   Smokeless tobacco: Never  Vaping Use   Vaping Use: Never used  Substance and Sexual Activity   Alcohol use: Yes    Alcohol/week: 10.0 standard drinks of alcohol    Types: 10 Standard drinks or equivalent per week   Drug use: Never   Sexual activity: Not on file  Other Topics Concern   Not on file   Social History Narrative   Not on file   Social Determinants of Health   Financial Resource Strain: Not on file  Food Insecurity: Not on file  Transportation Needs: Not on file  Physical Activity: Not on file  Stress: Not on file  Social Connections: Not on file        Objective:  Physical Exam: BP 130/83   Pulse 90   Temp 98 F (36.7 C) (Temporal)   Ht 6' 3.25" (1.911 m)   Wt 240 lb 9.6 oz (109.1 kg)   SpO2 100%   BMI 29.87 kg/m   Body mass index is 29.87 kg/m. Wt Readings from Last 3 Encounters:  12/21/21 240 lb 9.6 oz (109.1 kg)  02/25/19 258 lb 3.2 oz (117.1 kg)  02/07/19 252 lb 6.4 oz (114.5 kg)   Gen: NAD, resting comfortably HEENT: TMs normal bilaterally. OP clear. No thyromegaly noted.  CV: RRR with no murmurs appreciated Pulm: NWOB, CTAB with no crackles, wheezes, or rhonchi GI: Normal bowel sounds present. Soft, Nontender, Nondistended. MSK: no edema, cyanosis, or clubbing noted.  Left leg without deformities.  Normal strength with resisted hamstring bilaterally. Skin: warm, dry Neuro: CN2-12 grossly intact. Strength 5/5 in upper and lower extremities. Reflexes symmetric and intact bilaterally.  Psych: Normal affect and thought content     Smith Potenza M. Jerline Pain, MD 12/21/2021 1:39 PM

## 2021-12-22 LAB — HIV ANTIBODY (ROUTINE TESTING W REFLEX): HIV 1&2 Ab, 4th Generation: NONREACTIVE

## 2021-12-22 LAB — HEPATITIS C ANTIBODY: Hepatitis C Ab: NONREACTIVE

## 2021-12-23 NOTE — Progress Notes (Signed)
Please inform patient of the following:  His A1c is a little elevated compared to last year but everything else is normal.  Do not need to start any medications.  He should continue to work on diet and exercise and we can recheck everything in a year.

## 2021-12-24 ENCOUNTER — Encounter: Payer: Self-pay | Admitting: Family Medicine

## 2022-12-22 ENCOUNTER — Ambulatory Visit (INDEPENDENT_AMBULATORY_CARE_PROVIDER_SITE_OTHER): Payer: No Typology Code available for payment source | Admitting: Family Medicine

## 2022-12-22 ENCOUNTER — Encounter: Payer: Self-pay | Admitting: Family Medicine

## 2022-12-22 VITALS — BP 119/76 | HR 85 | Temp 97.5°F | Ht 75.0 in | Wt 247.0 lb

## 2022-12-22 DIAGNOSIS — Z131 Encounter for screening for diabetes mellitus: Secondary | ICD-10-CM

## 2022-12-22 DIAGNOSIS — M25551 Pain in right hip: Secondary | ICD-10-CM | POA: Diagnosis not present

## 2022-12-22 DIAGNOSIS — Z13220 Encounter for screening for lipoid disorders: Secondary | ICD-10-CM | POA: Diagnosis not present

## 2022-12-22 DIAGNOSIS — Z0001 Encounter for general adult medical examination with abnormal findings: Secondary | ICD-10-CM | POA: Diagnosis not present

## 2022-12-22 LAB — CBC
HCT: 41.8 % (ref 39.0–52.0)
Hemoglobin: 13.5 g/dL (ref 13.0–17.0)
MCHC: 32.3 g/dL (ref 30.0–36.0)
MCV: 88.3 fl (ref 78.0–100.0)
Platelets: 266 10*3/uL (ref 150.0–400.0)
RBC: 4.73 Mil/uL (ref 4.22–5.81)
RDW: 14.7 % (ref 11.5–15.5)
WBC: 7.4 10*3/uL (ref 4.0–10.5)

## 2022-12-22 LAB — LIPID PANEL
Cholesterol: 139 mg/dL (ref 0–200)
HDL: 48.9 mg/dL (ref >39.00)
LDL Cholesterol: 72 mg/dL (ref 0–99)
NonHDL: 90.36
Total CHOL/HDL Ratio: 3
Triglycerides: 93 mg/dL (ref 0.0–149.0)
VLDL: 18.6 mg/dL (ref 0.0–40.0)

## 2022-12-22 LAB — COMPREHENSIVE METABOLIC PANEL
ALT: 19 U/L (ref 0–53)
AST: 22 U/L (ref 0–37)
Albumin: 4.5 g/dL (ref 3.5–5.2)
Alkaline Phosphatase: 103 U/L (ref 39–117)
BUN: 13 mg/dL (ref 6–23)
CO2: 28 mEq/L (ref 19–32)
Calcium: 9.2 mg/dL (ref 8.4–10.5)
Chloride: 104 mEq/L (ref 96–112)
Creatinine, Ser: 0.98 mg/dL (ref 0.40–1.50)
GFR: 96.53 mL/min (ref >60.00)
Glucose, Bld: 107 mg/dL — ABNORMAL HIGH (ref 70–99)
Potassium: 3.9 mEq/L (ref 3.5–5.1)
Sodium: 139 mEq/L (ref 135–145)
Total Bilirubin: 0.4 mg/dL (ref 0.2–1.2)
Total Protein: 7.6 g/dL (ref 6.0–8.3)

## 2022-12-22 LAB — TSH: TSH: 1.06 u[IU]/mL (ref 0.35–5.50)

## 2022-12-22 LAB — HEMOGLOBIN A1C: Hgb A1c MFr Bld: 5.8 % (ref 4.6–6.5)

## 2022-12-22 NOTE — Progress Notes (Signed)
Chief Complaint:  Ryan Montes Below is a 42 y.o. male who presents today for his annual comprehensive physical exam.    Assessment/Plan:  Right Hip Pain History and exam consistent with hip flexor strain.  His symptoms have improved significantly last couple of weeks though still having minimal symptoms.  We discussed home exercises and handout was given.  He will let us know if not improving in the next few weeks and would consider referral to PT or sports medicine.  Preventative Healthcare: Check labs. Vaccines declined.   Patient Counseling(The following topics were reviewed and/or handout was given):  -Nutrition: Stressed importance of moderation in sodium/caffeine intake, saturated fat and cholesterol, caloric balance, sufficient intake of fresh fruits, vegetables, and fiber.  -Stressed the importance of regular exercise.   -Substance Abuse: Discussed cessation/primary prevention of tobacco, alcohol, or other drug use; driving or other dangerous activities under the influence; availability of treatment for abuse.   -Injury prevention: Discussed safety belts, safety helmets, smoke detector, smoking near bedding or upholstery.   -Sexuality: Discussed sexually transmitted diseases, partner selection, use of condoms, avoidance of unintended pregnancy and contraceptive alternatives.   -Dental health: Discussed importance of regular tooth brushing, flossing, and dental visits.  -Health maintenance and immunizations reviewed. Please refer to Health maintenance section.  Return to care in 1 year for next preventative visit.     Subjective:  HPI:  He has no acute complaints today.   He did injure his lower back a few weeks ago while at his kids soccer practice. States that he went to kick a ball about 3-41ft in the air and noticed immediate pain into his lower back radiating into his groin. Pain was very severe. Pain improved the next day but then had significant worsening the day after. Pain  was so severe that he had difficulty getting ready in the morning. His pain slowly improved over the next several weeks and is now minimal. He still does have some pain with certain motions.  Lifestyle Diet: None specific.  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      12/22/2022    9:03 AM  Depression screen PHQ 2/9  Decreased Interest 0  Down, Depressed, Hopeless 0  PHQ - 2 Score 0    There are no preventive care reminders to display for this patient.   ROS: Per HPI, otherwise a complete review of systems was negative.   PMH:  The following were reviewed and entered/updated in epic: History reviewed. No pertinent past medical history. There are no problems to display for this patient.  Past Surgical History:  Procedure Laterality Date   FRACTURE SURGERY     LAPAROSCOPIC APPENDECTOMY N/A 04/19/2018   Procedure: APPENDECTOMY LAPAROSCOPIC;  Surgeon: Darnell Level, MD;  Location: [facility];  Service: General;  Laterality: N/A;    Family History  Problem Relation Age of Onset   Cancer Mother        rectal   Diabetes Mother    Diabetes Maternal Grandfather     Medications- reviewed and updated Current Outpatient Medications  Medication Sig Dispense Refill   acetaminophen (TYLENOL) 500 MG tablet Take 500 mg by mouth every 6 (six) hours as needed.     ibuprofen (ADVIL) 200 MG tablet Take 200 mg by mouth 3 (three) times daily. (Patient not taking: Reported on 12/22/2022)     No current facility-administered medications for this visit.    Allergies-reviewed and updated Allergies  Allergen Reactions   Augmentin [Amoxicillin-Pot  Clavulanate] Hives   Suprax [Cefixime] Hives    Social History   Socioeconomic History   Marital status: Married    Spouse name: Not on file   Number of children: Not on file   Years of education: Not on file   Highest education level: Not on file  Occupational History   Not on file  Tobacco Use   Smoking  status: Never   Smokeless tobacco: Never  Vaping Use   Vaping status: Never Used  Substance and Sexual Activity   Alcohol use: Yes    Alcohol/week: 10.0 standard drinks of alcohol    Types: 10 Standard drinks or equivalent per week   Drug use: Never   Sexual activity: Not on file  Other Topics Concern   Not on file  Social History Narrative   Not on file   Social Determinants of Health   Financial Resource Strain: Not on file  Food Insecurity: Not on file  Transportation Needs: Not on file  Physical Activity: Not on file  Stress: Not on file  Social Connections: Not on file        Objective:  Physical Exam: BP 119/76   Pulse 85   Temp (!) 97.5 F (36.4 C) (Temporal)   Wt 247 lb (112 kg)   SpO2 99%   BMI 30.67 kg/m   Body mass index is 30.67 kg/m. Wt Readings from Last 3 Encounters:  12/22/22 247 lb (112 kg)  12/21/21 240 lb 9.6 oz (109.1 kg)  02/25/19 258 lb 3.2 oz (117.1 kg)   Gen: NAD, resting comfortably HEENT: TMs normal bilaterally. OP clear. No thyromegaly noted.  CV: RRR with no murmurs appreciated Pulm: NWOB, CTAB with no crackles, wheezes, or rhonchi GI: Normal bowel sounds present. Soft, Nontender, Nondistended. MSK: no edema, cyanosis, or clubbing noted - Legs: No deformities.  Significant weakness with right hip flexors.  Elicits some pain with this as well. Skin: warm, dry Neuro: CN2-12 grossly intact. Strength 5/5 in upper and lower extremities. Reflexes symmetric and intact bilaterally.  Psych: Normal affect and thought content     Kanon Colunga M. Jimmey Ralph, MD 12/22/2022 9:23 AM

## 2022-12-22 NOTE — Patient Instructions (Signed)
It was very nice to see you today!  We will check blood work.  I think you had a hip flexor strain.  Work on the exercises and let us know if not improving.  Please continue to work on diet and exercise.  Return in about 1 year (around 12/22/2023) for Annual Physical.   Take care, Dr Jimmey Ralph  PLEASE NOTE:  If you had any lab tests, please let us know if you have not heard back within a few days. You may see your results on mychart before we have a chance to review them but we will give you a call once they are reviewed by [place].   If we ordered any referrals today, please let us know if you have not heard from their office within the next week.   If you had any urgent prescriptions sent in today, please check with the pharmacy within an hour of our visit to make sure the prescription was transmitted appropriately.   Please try these tips to maintain a healthy lifestyle:  Eat at least 3 REAL meals and 1-2 snacks per day.  Aim for no more than 5 hours between eating.  If you eat breakfast, please do so within one hour of getting up.   Each meal should contain half fruits/vegetables, one quarter protein, and one quarter carbs (no bigger than a computer mouse)  Cut down on sweet beverages. This includes juice, soda, and sweet tea.   Drink at least 1 glass of water with each meal and aim for at least 8 glasses per day  Exercise at least 150 minutes every week.

## 2022-12-27 NOTE — Progress Notes (Signed)
Blood sugar is borderline elevated but stable compared to previous.  The rest of his labs are all stable.  Do not need to make any changes to treatment plan.  He should continue to work on diet and exercise and we can recheck everything in a year or so.

## 2023-01-12 ENCOUNTER — Encounter: Payer: No Typology Code available for payment source | Admitting: Family Medicine

## 2023-06-29 ENCOUNTER — Ambulatory Visit
Admission: RE | Admit: 2023-06-29 | Discharge: 2023-06-29 | Disposition: A | Discharge status: Home / Self Care | Source: Ambulatory Visit | Attending: Family Medicine | Admitting: Family Medicine

## 2023-06-29 VITALS — BP 147/82 | HR 92 | Temp 98.5°F | Resp 20

## 2023-06-29 DIAGNOSIS — J020 Streptococcal pharyngitis: Secondary | ICD-10-CM

## 2023-06-29 LAB — POCT RAPID STREP A (OFFICE): Rapid Strep A Screen: POSITIVE — AB

## 2023-06-29 MED ORDER — CLINDAMYCIN HCL 300 MG PO CAPS: 300.0000 mg | ORAL_CAPSULE | Freq: Three times a day (TID) | ORAL | 0 refills | Status: DC

## 2023-06-29 NOTE — ED Provider Notes (Signed)
 [facility]  Note:  This document was prepared using Conservation officer, historic buildings and may include unintentional dictation errors.  MRN: [id number] DOB: 1981-03-26  Subjective:   Ryan Montes is a 43 y.o. male presenting for 4-day history of throat pain, painful swallowing.  No fever, respiratory symptoms.  Has used ibuprofen with minimal relief.  No current facility-administered medications for this encounter.  Current Outpatient Medications:    acetaminophen (TYLENOL) 500 MG tablet, Take 500 mg by mouth every 6 (six) hours as needed., Disp: , Rfl:    ibuprofen (ADVIL) 200 MG tablet, Take 200 mg by mouth 3 (three) times daily. (Patient not taking: Reported on 12/22/2022), Disp: , Rfl:    Allergies  Allergen Reactions   Augmentin [Amoxicillin-Pot Clavulanate] Hives   Suprax [Cefixime] Hives    No past medical history on file.   Past Surgical History:  Procedure Laterality Date   FRACTURE SURGERY     LAPAROSCOPIC APPENDECTOMY N/A 04/19/2018   Procedure: APPENDECTOMY LAPAROSCOPIC;  Surgeon: Darnell Level, MD;  Location: [facility];  Service: General;  Laterality: N/A;    Family History  Problem Relation Age of Onset   Cancer Mother        rectal   Diabetes Mother    Diabetes Maternal Grandfather     Social History   Tobacco Use   Smoking status: Never   Smokeless tobacco: Never  Vaping Use   Vaping status: Never Used  Substance Use Topics   Alcohol use: Yes    Alcohol/week: 10.0 standard drinks of alcohol    Types: 10 Standard drinks or equivalent per week   Drug use: Never    ROS   Objective:   Vitals: There were no vitals taken for this visit.  Physical Exam Constitutional:      General: He is not in acute distress.    Appearance: Normal appearance. He is well-developed and normal weight. He is not ill-appearing, toxic-appearing or diaphoretic.  HENT:     Head: Normocephalic and atraumatic.     Right Ear: External ear normal.      Left Ear: External ear normal.     Nose: Nose normal.     Mouth/Throat:     Pharynx: Posterior oropharyngeal erythema present. No pharyngeal swelling, oropharyngeal exudate or uvula swelling.     Tonsils: No tonsillar exudate or tonsillar abscesses. 1+ on the right. 1+ on the left.  Eyes:     General: No scleral icterus.       Right eye: No discharge.        Left eye: No discharge.     Extraocular Movements: Extraocular movements intact.  Cardiovascular:     Rate and Rhythm: Normal rate.  Pulmonary:     Effort: Pulmonary effort is normal.  Musculoskeletal:     Cervical back: Normal range of motion.  Neurological:     Mental Status: He is alert and oriented to person, place, and time.  Psychiatric:        Mood and Affect: Mood normal.        Behavior: Behavior normal.        Thought Content: Thought content normal.        Judgment: Judgment normal.     Results for orders placed or performed during the hospital encounter of 06/29/23 (from the past 24 hours)  POCT rapid strep A     Status: Abnormal   Collection Time: 06/29/23  2:04 PM  Result Value Ref Range  Rapid Strep A Screen Positive (A) Negative    Assessment and Plan :   PDMP not reviewed this encounter.  1. Strep pharyngitis    Given medication allergies, recommended clindamycin.  Use supportive care otherwise. Counseled patient on potential for adverse effects with medications prescribed/recommended today, ER and return-to-clinic precautions discussed, patient verbalized understanding.    Wallis Bamberg, PA-C 06/29/23 1416

## 2023-06-29 NOTE — ED Triage Notes (Signed)
 Pt c/o sore throat x 4 days-denies fever-NAD-steady gait

## 2023-08-03 ENCOUNTER — Ambulatory Visit

## 2023-11-29 ENCOUNTER — Ambulatory Visit: Payer: No Typology Code available for payment source | Admitting: Family Medicine

## 2023-11-29 ENCOUNTER — Encounter: Payer: Self-pay | Admitting: Family Medicine

## 2023-11-29 VITALS — BP 112/70 | HR 81 | Temp 98.1°F | Ht 75.0 in | Wt 250.2 lb

## 2023-11-29 DIAGNOSIS — M2550 Pain in unspecified joint: Secondary | ICD-10-CM

## 2023-11-29 DIAGNOSIS — Z23 Encounter for immunization: Secondary | ICD-10-CM

## 2023-11-29 DIAGNOSIS — Z0001 Encounter for general adult medical examination with abnormal findings: Secondary | ICD-10-CM

## 2023-11-29 LAB — CBC
HCT: 41.7 % (ref 39.0–52.0)
Hemoglobin: 13.6 g/dL (ref 13.0–17.0)
MCHC: 32.7 g/dL (ref 30.0–36.0)
MCV: 86.8 fl (ref 78.0–100.0)
Platelets: 238 K/uL (ref 150.0–400.0)
RBC: 4.8 Mil/uL (ref 4.22–5.81)
RDW: 14.2 % (ref 11.5–15.5)
WBC: 6.3 K/uL (ref 4.0–10.5)

## 2023-11-29 LAB — COMPREHENSIVE METABOLIC PANEL WITH GFR
ALT: 23 U/L (ref 0–53)
AST: 24 U/L (ref 0–37)
Albumin: 4.4 g/dL (ref 3.5–5.2)
Alkaline Phosphatase: 108 U/L (ref 39–117)
BUN: 15 mg/dL (ref 6–23)
CO2: 29 meq/L (ref 19–32)
Calcium: 9 mg/dL (ref 8.4–10.5)
Chloride: 103 meq/L (ref 96–112)
Creatinine, Ser: 0.98 mg/dL (ref 0.40–1.50)
GFR: 95.9 mL/min (ref >60.00)
Glucose, Bld: 101 mg/dL — ABNORMAL HIGH (ref 70–99)
Potassium: 4 meq/L (ref 3.5–5.1)
Sodium: 139 meq/L (ref 135–145)
Total Bilirubin: 0.5 mg/dL (ref 0.2–1.2)
Total Protein: 7.7 g/dL (ref 6.0–8.3)

## 2023-11-29 LAB — LIPID PANEL
Cholesterol: 143 mg/dL (ref 0–200)
HDL: 44.7 mg/dL (ref >39.00)
LDL Cholesterol: 85 mg/dL (ref 0–99)
NonHDL: 98.62
Total CHOL/HDL Ratio: 3
Triglycerides: 66 mg/dL (ref 0.0–149.0)
VLDL: 13.2 mg/dL (ref 0.0–40.0)

## 2023-11-29 LAB — HEMOGLOBIN A1C: Hgb A1c MFr Bld: 6.2 % (ref 4.6–6.5)

## 2023-11-29 LAB — TSH: TSH: 1.12 u[IU]/mL (ref 0.35–5.50)

## 2023-11-29 NOTE — Progress Notes (Signed)
 Chief Complaint:  Ryan Montes is a 43 y.o. male who presents today for his annual comprehensive physical exam.    Assessment/Plan:  Chronic Problems Addressed Today: Joint pain Red flags.  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  Symptoms are managed with over-the-counter meds as needed.  We did recommend moderation of ibuprofen  use.  Also recommended Tylenol  and Voltaren as needed.   Preventative Healthcare: Check labs.  Tdap given today.  Declined HPV vaccine and flu vaccine.  Patient Counseling(The following topics were reviewed and/or handout was given):  -Nutrition: Stressed importance of moderation in sodium/caffeine intake, saturated fat and cholesterol, caloric balance, sufficient intake of fresh fruits, vegetables, and fiber.  -Stressed the importance of regular exercise.   -Substance Abuse: Discussed cessation/primary prevention of tobacco, alcohol, or other drug use; driving or other dangerous activities under the influence; availability of treatment for abuse.   -Injury prevention: Discussed safety belts, safety helmets, smoke detector, smoking near bedding or upholstery.   -Sexuality: Discussed sexually transmitted diseases, partner selection, use of condoms, avoidance of unintended pregnancy and contraceptive alternatives.   -Dental health: Discussed importance of regular tooth brushing, flossing, and dental visits.  -Health maintenance and immunizations reviewed. Please refer to Health maintenance section.  Return to care in 1 year for next preventative visit.     Subjective:  HPI:  He has no acute complaints today. Patient is here today for his  annual physical.  See assessment / plan for status of chronic conditions.  Discussed the use of AI scribe software for clinical note transcription with the patient, who gave verbal consent to proceed.  History of Present Illness Ryan Montes is a 43 year old male who presents for an annual physical exam.  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 Despite this, his weight has remained stable at around 250 pounds, which he attributes to increased muscle mass.  He experiences daily joint pain, particularly in his knees, ankles, and shoulders, which he attributes to his history of playing football and previous surgeries. He manages the pain with 5 to 6 ibuprofen  tablets daily, primarily after work and dinner, and has been doing so regularly since his last visit a year ago.  His diet includes more fruits, such as bananas and blueberries, and he eats salads a few times a week. He typically skips breakfast and sometimes lunch, especially during hot weather, and consumes a larger meal at dinner. He finds it challenging to reduce sweets and carbohydrates, particularly bread and chips.  He has not received a flu shot in the past few years and did not receive the COVID-19 vaccine. He is not planning to receive the HPV vaccine. He generally gets sick once a year, usually for a day or two, often due to illnesses brought home by his children.  His children, aged 110 and 73, have been in school for three weeks. The family did not travel over the summer due to work commitments and having two young [place]pherds at home.   Lifestyle Diet: Balanced. Plenty of fruits and vegetables.  Exercise: Coaching soccer.      11/29/2023    8:57 AM  Depression screen PHQ 2/9  Decreased Interest 0  Down, Depressed, Hopeless 0  PHQ - 2 Score 0    There are no preventive care reminders to display for this patient.   ROS: Per HPI, otherwise a complete review of systems was negative.  PMH:  The following were reviewed and entered/updated in epic: History reviewed. No pertinent past medical history. Patient Active Problem List   Diagnosis Date Noted   Joint pain 11/29/2023   Past Surgical History:  Procedure Laterality Date   FRACTURE  SURGERY     LAPAROSCOPIC APPENDECTOMY N/A 04/19/2018   Procedure: APPENDECTOMY LAPAROSCOPIC;  Surgeon: Eletha Boas, MD;  Location: [facility];  Service: General;  Laterality: N/A;    Family History  Problem Relation Age of Onset   Cancer Mother        rectal   Diabetes Mother    Diabetes Maternal Grandfather     Medications- reviewed and updated Current Outpatient Medications  Medication Sig Dispense Refill   ibuprofen  (ADVIL ) 200 MG tablet Take 200 mg by mouth 3 (three) times daily.     Multiple Vitamin (MULTIVITAMIN) tablet Take 1 tablet by mouth daily.     No current facility-administered medications for this visit.    Allergies-reviewed and updated Allergies  Allergen Reactions   Augmentin [Amoxicillin-Pot Clavulanate] Hives   Suprax [Cefixime] Hives    Social History   Socioeconomic History   Marital status: Married    Spouse name: Not on file   Number of children: Not on file   Years of education: Not on file   Highest education level: Not on file  Occupational History   Not on file  Tobacco Use   Smoking status: Never   Smokeless tobacco: Never  Vaping Use   Vaping status: Never Used  Substance and Sexual Activity   Alcohol use: Not Currently    Comment: daily   Drug use: Never   Sexual activity: Not on file  Other Topics Concern   Not on file  Social History Narrative   Not on file   Social Drivers of Health   Financial Resource Strain: Not on file  Food Insecurity: Not on file  Transportation Needs: Not on file  Physical Activity: Not on file  Stress: Not on file  Social Connections: Not on file        Objective:  Physical Exam: BP 112/70   Pulse 81   Temp 98.1 F (36.7 C) (Temporal)   Ht 6' 3 (1.905 m)   Wt 250 lb 3.2 oz (113.5 kg)   SpO2 98%   BMI 31.27 kg/m   Body mass index is 31.27 kg/m. Wt Readings from Last 3 Encounters:  11/29/23 250 lb 3.2 oz (113.5 kg)  12/22/22 247 lb (112 kg)  12/21/21 240 lb 9.6 oz (109.1 kg)   Gen:  NAD, resting comfortably HEENT: TMs normal bilaterally. OP clear. No thyromegaly noted.  CV: RRR with no murmurs appreciated Pulm: NWOB, CTAB with no crackles, wheezes, or rhonchi GI: Normal bowel sounds present. Soft, Nontender, Nondistended. MSK: no edema, cyanosis, or clubbing noted Skin: warm, dry Neuro: CN2-12 grossly intact. Strength 5/5 in upper and lower extremities. Reflexes symmetric and intact bilaterally.  Psych: Normal affect and thought content     Eamon Tantillo M. Kennyth, MD 11/29/2023 9:39 AM

## 2023-11-29 NOTE — Patient Instructions (Signed)
 It was very nice to see you today!  VISIT SUMMARY: You came in for your annual physical exam. 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 We discussed your diet, joint pain management, and vaccinations.  YOUR PLAN: ADULT WELLNESS VISIT: You attended your annual wellness visit with stable weight. You engage in physical activity and your diet includes fruits and occasional salads, but you skip breakfast and sometimes lunch, with dinner as your main meal. -Try to reduce sweets and carbs for better weight management. -You declined the HPV vaccine after discussing its benefits and risks. -You have not received recent flu or COVID-19 vaccines. Consider getting these vaccines. -Colon cancer screening will start at age 17. -Blood work will be ordered as part of the wellness program. -You will receive a tetanus vaccine today. -Schedule colon cancer screening at age 89.  CHRONIC JOINT PAIN: You experience chronic joint pain in your ankles and shoulders, likely due to past sports and an active lifestyle. You take 5-6 ibuprofen  daily, 5 out of 7 days a week. -Be aware of the risks of long-term ibuprofen  use, including potential for stomach ulcers. -Alternate ibuprofen  with Tylenol  to reduce ibuprofen  intake. -Use Voltaren (diclofenac) cream for joint pain.  Return in about 1 year (around 11/28/2024) for Annual Physical.   Take care, Dr Kennyth  PLEASE NOTE:  If you had any lab tests, please let us  know if you have not heard back within a few days. You may see your results on mychart before we have a chance to review them but we will give you a call once they are reviewed by us .   If we ordered any referrals today, please let us  know if you have not heard from their office within the next week.   If you had any urgent prescriptions sent in today, please check with the pharmacy within an hour of our visit to make sure the  prescription was transmitted appropriately.   Please try these tips to maintain a healthy lifestyle:  Eat at least 3 REAL meals and 1-2 snacks per day.  Aim for no more than 5 hours between eating.  If you eat breakfast, please do so within one hour of getting up.   Each meal should contain half fruits/vegetables, one quarter protein, and one quarter carbs (no bigger than a computer mouse)  Cut down on sweet beverages. This includes juice, soda, and sweet tea.   Drink at least 1 glass of water with each meal and aim for at least 8 glasses per day  Exercise at least 150 minutes every week.     Preventive Care 71-61 Years Old, Male Preventive care refers to lifestyle choices and visits with your health care provider that can promote health and wellness. Preventive care visits are also called wellness exams. What can I expect for my preventive care visit? Counseling During your preventive care visit, your health care provider may ask about your: Medical history, including: Past medical problems. Family medical history. Current health, including: Emotional well-being. Home life and relationship well-being. Sexual activity. Lifestyle, including: Alcohol, nicotine or tobacco, and drug use. Access to firearms. Diet, exercise, and sleep habits. Safety issues such as seatbelt and bike helmet use. Sunscreen use. Work and work Astronomer. Physical exam Your health care provider will check your: Height and weight. These may be used to calculate your BMI (body mass index). BMI is a measurement that tells if you are at a healthy  weight. Waist circumference. This measures the distance around your waistline. This measurement also tells if you are at a healthy weight and may help predict your risk of certain diseases, such as type 2 diabetes and high blood pressure. Heart rate and blood pressure. Body temperature. Skin for abnormal spots. What immunizations do I need?  Vaccines are usually  given at various ages, according to a schedule. Your health care provider will recommend vaccines for you based on your age, medical history, and lifestyle or other factors, such as travel or where you work. What tests do I need? Screening Your health care provider may recommend screening tests for certain conditions. This may include: Lipid and cholesterol levels. Diabetes screening. This is done by checking your blood sugar (glucose) after you have not eaten for a while (fasting). Hepatitis B test. Hepatitis C test. HIV (human immunodeficiency virus) test. STI (sexually transmitted infection) testing, if you are at risk. Lung cancer screening. Prostate cancer screening. Colorectal cancer screening. Talk with your health care provider about your test results, treatment options, and if necessary, the need for more tests. Follow these instructions at home: Eating and drinking  Eat a diet that includes fresh fruits and vegetables, whole grains, lean protein, and low-fat dairy products. Take vitamin and mineral supplements as recommended by your health care provider. Do not drink alcohol if your health care provider tells you not to drink. If you drink alcohol: Limit how much you have to 0-2 drinks a day. Know how much alcohol is in your drink. In the [place] one drink equals one 12 oz bottle of beer (355 mL), one 5 oz glass of wine (148 mL), or one 1 oz glass of hard liquor (44 mL). Lifestyle Brush your teeth every morning and night with fluoride toothpaste. Floss one time each day. Exercise for at least 30 minutes 5 or more days each week. Do not use any products that contain nicotine or tobacco. These products include cigarettes, chewing tobacco, and vaping devices, such as e-cigarettes. If you need help quitting, ask your health care provider. Do not use drugs. If you are sexually active, practice safe sex. Use a condom or other form of protection to prevent STIs. Take aspirin only as  told by your health care provider. Make sure that you understand how much to take and what form to take. Work with your health care provider to find out whether it is safe and beneficial for you to take aspirin daily. Find healthy ways to manage stress, such as: Meditation, yoga, or listening to music. Journaling. Talking to a trusted person. Spending time with friends and family. Minimize exposure to UV radiation to reduce your risk of skin cancer. Safety Always wear your seat belt while driving or riding in a vehicle. Do not drive: If you have been drinking alcohol. Do not ride with someone who has been drinking. When you are tired or distracted. While texting. If you have been using any mind-altering substances or drugs. Wear a helmet and other protective equipment during sports activities. If you have firearms in your house, make sure you follow all gun safety procedures. What's next? Go to your health care provider once a year for an annual wellness visit. Ask your health care provider how often you should have your eyes and teeth checked. Stay up to date on all vaccines. This information is not intended to replace advice given to you by your health care provider. Make sure you discuss any questions you have with your  health care provider. Document Revised: 09/09/2020 Document Reviewed: 09/09/2020 Elsevier Patient Education  2024 [employer].

## 2023-11-29 NOTE — Assessment & Plan Note (Signed)
 Red flags.  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  Symptoms are managed with over-the-counter meds as needed.  We did recommend moderation of ibuprofen  use.  Also recommended Tylenol  and Voltaren as needed.

## 2023-11-30 ENCOUNTER — Ambulatory Visit: Payer: Self-pay | Admitting: Family Medicine

## 2023-11-30 NOTE — Progress Notes (Signed)
 His A1c is up a little bit to 6.2 however the rest of his labs are all at goal.  Do not need to start meds for this however he should continue to work on diet and exercise and we can recheck everything in a year.  It is okay to complete his paperwork for work.

## 2024-02-05 ENCOUNTER — Ambulatory Visit: Admitting: Family Medicine

## 2024-02-05 VITALS — BP 138/88 | HR 87 | Temp 97.7°F | Ht 75.0 in | Wt 251.8 lb

## 2024-02-05 DIAGNOSIS — M79671 Pain in right foot: Secondary | ICD-10-CM

## 2024-02-05 NOTE — Progress Notes (Signed)
   Ryan Montes is a 43 y.o. male who presents today for an office visit.  Assessment/Plan:  Foot Pain  Exam and history are consistent with metatarsalgia.  We discussed conservative measures and use of metatarsal pad and good foot support.  Also discussed trial of anti-inflammatory however he would like to hold off on prescription for today.  He can use Tylenol  or ibuprofen  as needed.  He will let us  know if not improving in the next 2 weeks with over-the-counter metatarsal pad and would consider referral to sports medicine for further evaluation and possible custom orthotics at that time.     Subjective:  HPI:  See assessment / plan for status of chronic conditions.     Discussed the use of AI scribe software for clinical note transcription with the patient, who gave verbal consent to proceed.  History of Present Illness Ryan Montes is a 43 year old male who presents with right foot pain, specifically in the second toe, after increased soccer activity.  He has been experiencing right foot pain, particularly in the second toe, for about a month. The pain is described as throbbing and sharp, with a severity of nine out of ten on the pain scale. 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 The pain is particularly noticeable when wearing boots or shoes, and he describes a sensation of the toe 'trying to curl up' with numbness at the tip. No swelling or bruising has been observed, and the patient does not recall a specific injury that initiated the pain.  His past medical history includes multiple surgeries and injuries related to sports, including a previous operation on his right leg involving the tibia and fibula with plates, screws, and pins inserted. He also had a past issue with sesamoid bones in his big toe during college, which required surgical intervention.  His typical footwear includes work boots, sneakers, and  cleats. Despite using icy hot patches and wrapping the toe, he has not noticed any relief from the pain. He is currently taking Tylenol  for pain management but has not been using it consistently. The pain affects his mobility, making him feel 'like I'm a hundred' when getting up from sitting for long periods.  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 No swelling or bruising of the right foot but reports numbness at the tip of the second toe and throbbing, sharp pain when wearing boots or shoes.         Objective:  Physical Exam: BP 138/88   Pulse 87   Temp 97.7 F (36.5 C) (Temporal)   Ht 6' 3 (1.905 m)   Wt 251 lb 12.8 oz (114.2 kg)   SpO2 95%   BMI 31.47 kg/m   Gen: No acute distress, resting comfortably MUSCULOSKELETAL - Right Foot: Loss of transverse arch noted.  No other deformities.  Tenderness to palpation along 2nd and 3rd metatarsal head.  No click with lateral squeeze.  Neurovascular intact distally. Neuro: Grossly normal, moves all extremities Psych: Normal affect and thought content      Dorea Duff M. Kennyth, MD 02/05/2024 11:31 AM

## 2024-02-05 NOTE — Patient Instructions (Signed)
 It was very nice to see you today!  VISIT SUMMARY: You visited us  today due to pain in your right foot, specifically in the second toe, which has been troubling you for about a month. The pain is sharp and throbbing, especially when you wear boots or shoes, and it affects your mobility.  YOUR PLAN: METATARSALGIA, RIGHT FOOT: You have pain and numbness in your second toe and the ball of your foot, likely due to overuse and altered foot mechanics from increased soccer activity. -Use over-the-counter metatarsal pads for arch support. -Take anti-inflammatory medications like Tylenol  or ibuprofen  for pain relief. -If there is no improvement, consider custom orthotics. We can refer you to Dr. Marquette for fitting if needed. -Send a MyChart message in two weeks to report your progress.  Return if symptoms worsen or fail to improve.   Take care, Dr Kennyth  PLEASE NOTE:  If you had any lab tests, please let us  know if you have not heard back within a few days. You may see your results on mychart before we have a chance to review them but we will give you a call once they are reviewed by us .   If we ordered any referrals today, please let us  know if you have not heard from their office within the next week.   If you had any urgent prescriptions sent in today, please check with the pharmacy within an hour of our visit to make sure the prescription was transmitted appropriately.   Please try these tips to maintain a healthy lifestyle:  Eat at least 3 REAL meals and 1-2 snacks per day.  Aim for no more than 5 hours between eating.  If you eat breakfast, please do so within one hour of getting up.   Each meal should contain half fruits/vegetables, one quarter protein, and one quarter carbs (no bigger than a computer mouse)  Cut down on sweet beverages. This includes juice, soda, and sweet tea.   Drink at least 1 glass of water with each meal and aim for at least 8 glasses per day  Exercise at least  150 minutes every week.

## 2024-11-29 ENCOUNTER — Encounter: Admitting: Family Medicine
